# Patient Record
Sex: Female | Born: 1995 | Race: Black or African American | Hispanic: No | Marital: Single | Smoking: Former smoker
Health system: Southern US, Community
[De-identification: ages and names within clinical notes are randomized; demographics above are authoritative.]

## PROBLEM LIST (undated history)

## (undated) DIAGNOSIS — L309 Dermatitis, unspecified: Secondary | ICD-10-CM

## (undated) DIAGNOSIS — Z87440 Personal history of urinary (tract) infections: Secondary | ICD-10-CM

## (undated) HISTORY — DX: Dermatitis, unspecified: L30.9

## (undated) HISTORY — PX: OTHER SURGICAL HISTORY: SHX169

## (undated) HISTORY — DX: Personal history of urinary (tract) infections: Z87.440

---
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
## 2015-06-29 ENCOUNTER — Ambulatory Visit (INDEPENDENT_AMBULATORY_CARE_PROVIDER_SITE_OTHER): Payer: Medicaid Other | Admitting: Family Medicine

## 2015-06-29 ENCOUNTER — Encounter: Payer: Self-pay | Admitting: Family Medicine

## 2015-06-29 VITALS — BP 110/74 | HR 79 | Temp 98.4°F | Resp 17 | Ht 71.0 in | Wt 133.9 lb

## 2015-06-29 DIAGNOSIS — W57XXXA Bitten or stung by nonvenomous insect and other nonvenomous arthropods, initial encounter: Secondary | ICD-10-CM

## 2015-06-29 DIAGNOSIS — S40262A Insect bite (nonvenomous) of left shoulder, initial encounter: Secondary | ICD-10-CM

## 2015-06-29 DIAGNOSIS — L989 Disorder of the skin and subcutaneous tissue, unspecified: Secondary | ICD-10-CM | POA: Insufficient documentation

## 2015-06-29 NOTE — Progress Notes (Signed)
Name: KAYDE ATKERSON   MRN: [id number]    DOB: 1996-04-08   Date:06/29/2015       Progress Note  Subjective  Chief Complaint  Chief Complaint  Patient presents with  . Acute Visit    Spider Bite left arm (Dr. Carlynn Purl pt) x3-4 days    HPI Insect bite  Pt. Is here for evaluation of what appears to be a spider bite. She was at her home and woke up with intense pain and itching on her left shoulder. She describes the lesion as a 'blackhead' which later developed  a'circle around it'. She noticed a spider in her clothes the next day and thinks that she may have been bitten by a spider. She thinks it may have been a 'wool' spider. She has since experienced pain and swelling at the site of the bite. She has no fevers, chills, nausea, vomiting.   History reviewed. No pertinent past medical history.  History reviewed. No pertinent past surgical history.  History reviewed. No pertinent family history.  Social History   Social History  . Marital Status: Single    Spouse Name: N/A  . Number of Children: N/A  . Years of Education: N/A   Occupational History  . Not on file.   Social History Main Topics  . Smoking status: Current Every Day Smoker  . Smokeless tobacco: Never Used  . Alcohol Use: 0.0 oz/week    0 Standard drinks or equivalent per week     Comment: occasional  . Drug Use: No  . Sexual Activity:    Partners: Male    Birth Control/ Protection: Condom   Other Topics Concern  . Not on file   Social History Narrative  . No narrative on file    No current outpatient prescriptions on file.  No Known Allergies   Review of Systems  Constitutional: Negative for fever and chills.  Skin: Positive for itching and rash.     Objective  Filed Vitals:   06/29/15 1448  BP: 110/74  Pulse: 79  Temp: 98.4 F (36.9 C)  TempSrc: Oral  Resp: 17  Height:  (1.803 m)  Weight: 133 lb 14.4 oz (60.737 kg)  SpO2: 99%    Physical Exam  Constitutional: She is  well-developed, well-nourished, and in no distress.  Cardiovascular: Normal rate and regular rhythm.   Pulmonary/Chest: Effort normal and breath sounds normal.  Skin:     Localized maculo-papulr lesion with surrounding tenderness to mild palpation. No pus, no erythema. Multiple macular oval shaped lesions on her abdomen and breasts.  Nursing note and vitals reviewed.  Assessment & Plan  1. Insect bite of shoulder, left, initial encounter  Obtain labs to rule out tickborne diseases. Recommended application of topical antibiotic. Patient to RTC if she has any concerning symptoms such as fever, malaise, nausea, vomiting etc. Patient verbalized understanding.  - CBC w/Diff/Platelet - Basic Metabolic Panel (BMET) - Lyme Disease, IgM, Early Test w/ Rflx - [place] spotted fvr ab, IgM-blood  2. Skin lesion  - Ambulatory referral to Dermatology   Dionisios Ricci Asad [facility] 06/29/2015 3:25 PM

## 2015-07-05 ENCOUNTER — Ambulatory Visit: Payer: Self-pay | Admitting: Family Medicine

## 2015-08-09 ENCOUNTER — Ambulatory Visit: Payer: Medicaid Other | Admitting: Family Medicine

## 2015-08-18 ENCOUNTER — Encounter: Payer: Self-pay | Admitting: Family Medicine

## 2015-08-18 ENCOUNTER — Ambulatory Visit (INDEPENDENT_AMBULATORY_CARE_PROVIDER_SITE_OTHER): Payer: Medicaid Other | Admitting: Family Medicine

## 2015-08-18 VITALS — BP 104/62 | HR 93 | Temp 98.2°F | Resp 14 | Ht 69.0 in | Wt 134.2 lb

## 2015-08-18 DIAGNOSIS — R309 Painful micturition, unspecified: Secondary | ICD-10-CM | POA: Diagnosis not present

## 2015-08-18 DIAGNOSIS — Z23 Encounter for immunization: Secondary | ICD-10-CM | POA: Diagnosis not present

## 2015-08-18 DIAGNOSIS — L309 Dermatitis, unspecified: Secondary | ICD-10-CM | POA: Insufficient documentation

## 2015-08-18 LAB — POCT URINALYSIS DIPSTICK
Bilirubin, UA: NEGATIVE
GLUCOSE UA: NEGATIVE
Ketones, UA: NEGATIVE
Nitrite, UA: POSITIVE
PH UA: 6.5
Protein, UA: NEGATIVE
SPEC GRAV UA: 1.025
UROBILINOGEN UA: 0.2

## 2015-08-18 MED ORDER — CIPROFLOXACIN HCL 250 MG PO TABS: 250.0000 mg | ORAL_TABLET | Freq: Two times a day (BID) | ORAL | Status: DC

## 2015-08-18 NOTE — Progress Notes (Signed)
Name: Marcille BuffyRaven T Strahm   MRN: [id number]    DOB: [birth year]   Date:08/18/2015       Progress Note  Subjective  Chief Complaint  Chief Complaint  Patient presents with  . Urinary Tract Infection    onset 2 weeks, painful urination, hematuria and frequency    HPI  Dysuria: symptoms started about 2 weeks ago, with frequency, nocturia, hematuria - only noticed when she wipes, no hesitancy. Worse symptoms at the end of micturition. No back pain, no fever, mild discomfort during sex, no vulva rash, no vaginal discharge  Patient Active Problem List   Diagnosis Date Noted  . Chronic dermatitis 08/18/2015    History reviewed. No pertinent past surgical history.  History reviewed. No pertinent family history.  Social History   Social History  . Marital Status: Single    Spouse Name: N/A  . Number of Children: N/A  . Years of Education: N/A   Occupational History  . Not on file.   Social History Main Topics  . Smoking status: Current Every Day Smoker  . Smokeless tobacco: Never Used  . Alcohol Use: 0.0 oz/week    0 Standard drinks or equivalent per week     Comment: occasional  . Drug Use: No  . Sexual Activity:    Partners: Male    Birth Control/ Protection: Condom   Other Topics Concern  . Not on file   Social History Narrative    No current outpatient prescriptions on file.  No Known Allergies   ROS   Ten systems reviewed and is negative except as mentioned in HPI   Objective  Filed Vitals:   08/18/15 1528  BP: 104/62  Pulse: 93  Temp: 98.2 F (36.8 C)  TempSrc: Oral  Resp: 14  Height: 5\' 9"  (1.753 m)  Weight: 134 lb 3.2 oz (60.873 kg)  SpO2: 98%    Body mass index is 19.81 kg/(m^2).  Physical Exam  Constitutional: Patient appears well-developed and well-nourished.No distress.  HEENT: head atraumatic, normocephalic, pupils equal and reactive to light,  neck supple, throat within normal limits Cardiovascular: Normal rate, regular rhythm and  normal heart sounds.  No murmur heard. No BLE edema. Pulmonary/Chest: Effort normal and breath sounds normal. No respiratory distress. Abdominal: Soft.  There is no tenderness. Negative for CVA tenderness Psychiatric: Patient has a normal mood and affect. behavior is normal. Judgment and thought content normal.  Recent Results (from the past 2160 hour(s))  POCT urinalysis dipstick     Status: Abnormal   Collection Time: 08/18/15  3:34 PM  Result Value Ref Range   Color, UA yellow    Clarity, UA clear    Glucose, UA neg    Bilirubin, UA neg    Ketones, UA neg    Spec Grav, UA 1.025    Blood, UA 3+    pH, UA 6.5    Protein, UA neg    Urobilinogen, UA 0.2    Nitrite, UA positive    Leukocytes, UA large (3+) (A) Negative      PHQ2/9: Depression screen [facility] 06/29/2015  Decreased Interest 0  Down, Depressed, Hopeless 3  PHQ - 2 Score 3  Altered sleeping 1  Tired, decreased energy 0  Change in appetite 0  Feeling bad or failure about yourself  0  Trouble concentrating 0  Moving slowly or fidgety/restless 0  Suicidal thoughts 0  PHQ-9 Score 4  Difficult doing work/chores Somewhat difficult     Fall Risk: Fall Risk  06/29/2015  Falls in the past year? No      Assessment & Plan  1. Painful urination  Discussed possible diagnosis we will start on antibiotics today  - POCT urinalysis dipstick - Urine culture - GC/chlamydia probe amp, urine - ciprofloxacin (CIPRO) 250 MG tablet; Take 1 tablet (250 mg total) by mouth 2 (two) times daily.  Dispense: 6 tablet; Refill: 0  2. Needs flu shot  - Flu Vaccine QUAD 36+ mos PF IM (Fluarix & Fluzone Quad PF)

## 2015-08-19 ENCOUNTER — Other Ambulatory Visit: Payer: Self-pay | Admitting: Family Medicine

## 2015-08-21 LAB — URINE CULTURE

## 2015-08-22 LAB — PLEASE NOTE

## 2015-08-23 LAB — GC/CHLAMYDIA PROBE AMP
Chlamydia trachomatis, NAA: NEGATIVE
NEISSERIA GONORRHOEAE BY PCR: NEGATIVE

## 2015-08-23 LAB — SPECIMEN STATUS REPORT

## 2015-10-27 ENCOUNTER — Encounter: Payer: Self-pay | Admitting: *Deleted

## 2015-10-27 ENCOUNTER — Emergency Department: Payer: Medicaid Other

## 2015-10-27 ENCOUNTER — Emergency Department
Admission: EM | Admit: 2015-10-27 | Discharge: 2015-10-28 | Disposition: A | Discharge status: Home / Self Care | Payer: Medicaid Other | Attending: Emergency Medicine | Admitting: Emergency Medicine

## 2015-10-27 DIAGNOSIS — R251 Tremor, unspecified: Secondary | ICD-10-CM | POA: Diagnosis not present

## 2015-10-27 DIAGNOSIS — F1010 Alcohol abuse, uncomplicated: Secondary | ICD-10-CM

## 2015-10-27 DIAGNOSIS — F4324 Adjustment disorder with disturbance of conduct: Secondary | ICD-10-CM

## 2015-10-27 DIAGNOSIS — Z793 Long term (current) use of hormonal contraceptives: Secondary | ICD-10-CM | POA: Insufficient documentation

## 2015-10-27 DIAGNOSIS — Z792 Long term (current) use of antibiotics: Secondary | ICD-10-CM | POA: Diagnosis not present

## 2015-10-27 DIAGNOSIS — R45851 Suicidal ideations: Secondary | ICD-10-CM

## 2015-10-27 DIAGNOSIS — R42 Dizziness and giddiness: Secondary | ICD-10-CM | POA: Diagnosis not present

## 2015-10-27 DIAGNOSIS — R55 Syncope and collapse: Secondary | ICD-10-CM | POA: Diagnosis not present

## 2015-10-27 DIAGNOSIS — F17200 Nicotine dependence, unspecified, uncomplicated: Secondary | ICD-10-CM | POA: Insufficient documentation

## 2015-10-27 DIAGNOSIS — Z3202 Encounter for pregnancy test, result negative: Secondary | ICD-10-CM | POA: Insufficient documentation

## 2015-10-27 DIAGNOSIS — F419 Anxiety disorder, unspecified: Secondary | ICD-10-CM | POA: Insufficient documentation

## 2015-10-27 DIAGNOSIS — R4182 Altered mental status, unspecified: Secondary | ICD-10-CM | POA: Diagnosis present

## 2015-10-27 DIAGNOSIS — F1210 Cannabis abuse, uncomplicated: Secondary | ICD-10-CM | POA: Diagnosis not present

## 2015-10-27 LAB — URINE DRUG SCREEN, QUALITATIVE (ARMC ONLY)
Amphetamines, Ur Screen: NOT DETECTED
BARBITURATES, UR SCREEN: NOT DETECTED
BENZODIAZEPINE, UR SCRN: NOT DETECTED
Cannabinoid 50 Ng, Ur ~~LOC~~: POSITIVE — AB
Cocaine Metabolite,Ur ~~LOC~~: NOT DETECTED
MDMA (Ecstasy)Ur Screen: NOT DETECTED
METHADONE SCREEN, URINE: NOT DETECTED
OPIATE, UR SCREEN: NOT DETECTED
Phencyclidine (PCP) Ur S: NOT DETECTED
Tricyclic, Ur Screen: NOT DETECTED

## 2015-10-27 LAB — CBC WITH DIFFERENTIAL/PLATELET
BASOS PCT: 1 %
Basophils Absolute: 0 10*3/uL (ref 0–0.1)
EOS ABS: 0.1 10*3/uL (ref 0–0.7)
EOS PCT: 1 %
HCT: 45.8 % (ref 35.0–47.0)
Hemoglobin: 15.7 g/dL (ref 12.0–16.0)
LYMPHS ABS: 2.8 10*3/uL (ref 1.0–3.6)
Lymphocytes Relative: 52 %
MCH: 31.2 pg (ref 26.0–34.0)
MCHC: 34.3 g/dL (ref 32.0–36.0)
MCV: 91.1 fL (ref 80.0–100.0)
MONOS PCT: 8 %
Monocytes Absolute: 0.4 10*3/uL (ref 0.2–0.9)
Neutro Abs: 2.1 10*3/uL (ref 1.4–6.5)
Neutrophils Relative %: 38 %
PLATELETS: 202 10*3/uL (ref 150–440)
RBC: 5.03 MIL/uL (ref 3.80–5.20)
RDW: 12.6 % (ref 11.5–14.5)
WBC: 5.4 10*3/uL (ref 3.6–11.0)

## 2015-10-27 LAB — COMPREHENSIVE METABOLIC PANEL
ALT: 16 U/L (ref 14–54)
ANION GAP: 4 — AB (ref 5–15)
AST: 15 U/L (ref 15–41)
Albumin: 3.9 g/dL (ref 3.5–5.0)
Alkaline Phosphatase: 43 U/L (ref 38–126)
BILIRUBIN TOTAL: 0.8 mg/dL (ref 0.3–1.2)
BUN: 11 mg/dL (ref 6–20)
CO2: 25 mmol/L (ref 22–32)
Calcium: 8.5 mg/dL — ABNORMAL LOW (ref 8.9–10.3)
Chloride: 111 mmol/L (ref 101–111)
Creatinine, Ser: 0.94 mg/dL (ref 0.44–1.00)
GFR calc Af Amer: >60 mL/min (ref >60)
Glucose, Bld: 98 mg/dL (ref 65–99)
POTASSIUM: 3.4 mmol/L — AB (ref 3.5–5.1)
Sodium: 140 mmol/L (ref 135–145)
TOTAL PROTEIN: 6.3 g/dL — AB (ref 6.5–8.1)

## 2015-10-27 LAB — URINALYSIS COMPLETE WITH MICROSCOPIC (ARMC ONLY)
BILIRUBIN URINE: NEGATIVE
Glucose, UA: NEGATIVE mg/dL
Ketones, ur: NEGATIVE mg/dL
LEUKOCYTES UA: NEGATIVE
Nitrite: NEGATIVE
PH: 7 (ref 5.0–8.0)
PROTEIN: NEGATIVE mg/dL
Specific Gravity, Urine: 1.012 (ref 1.005–1.030)

## 2015-10-27 LAB — SALICYLATE LEVEL

## 2015-10-27 LAB — TROPONIN I: Troponin I: <0.03 ng/mL (ref <0.031)

## 2015-10-27 LAB — HCG, QUANTITATIVE, PREGNANCY

## 2015-10-27 LAB — ACETAMINOPHEN LEVEL: Acetaminophen (Tylenol), Serum: <10 ug/mL — ABNORMAL LOW (ref 10–30)

## 2015-10-27 MED ORDER — LORAZEPAM 0.5 MG PO TABS: 0.5000 mg | ORAL_TABLET | Freq: Once | ORAL | Status: DC

## 2015-10-27 MED ORDER — SODIUM CHLORIDE 0.9 % IV BOLUS (SEPSIS)
1000.0000 mL | Freq: Once | INTRAVENOUS | Status: AC
  Administered 2015-10-27: 1000 mL via INTRAVENOUS

## 2015-10-27 NOTE — ED Notes (Signed)
[facility]. Per EMS pt was found on the couch, unresponsive with constricted pupils. Pt given 2mg  narcan in route, AAOx4 since then. Per pt, pt began to have abd pain, went to use restroom, became dizzy and passed out. Pt called for brother who carried pt to couch where she was found by EMS. Pt denies taking any medications or drinking ETOH. Pt states used marijuana yesterday. Upon arrival pt AAOx4, denies any pain at this time. Pt states she does have thoughts of wanting to hurt herself due to death of brother and step father this past year, but denies any specific plans.
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
## 2015-10-27 NOTE — ED Notes (Signed)
Pt explained and updated regarding status, pt became very agitated and upset. MD Quale at bedside speaking to family. [employer] officers at bedside attempting to calm pt down.

## 2015-10-27 NOTE — ED Notes (Signed)
Pt used bedpan, tolerated well, no acute distress noted at this time.

## 2015-10-27 NOTE — ED Notes (Signed)

## 2015-10-27 NOTE — ED Notes (Signed)
MD Quale at bedside updating pt and pt's family.

## 2015-10-27 NOTE — ED Notes (Signed)
[facility]  Will more patient when officer arrives.

## 2015-10-27 NOTE — ED Notes (Signed)
D: Patient denies SI/HI/AVH. Patient wanded by security and oriented to unit.  No distress noted.  Patient is pleasant and cooperative.  Q15 min checks continued for patient safety.

## 2015-10-27 NOTE — [facility] Counselor (Signed)
Writer ran pt with Psych MD Dr. Toni Amendlapacs who stated that Pt will be assessed in the morning.

## 2015-10-27 NOTE — ED Provider Notes (Addendum)
[facility] Emergency Department Provider Note REMINDER - THIS NOTE IS NOT A FINAL MEDICAL RECORD UNTIL IT IS SIGNED. UNTIL THEN, THE CONTENT BELOW MAY REFLECT INFORMATION FROM A DOCUMENTATION TEMPLATE, NOT THE ACTUAL PATIENT VISIT. ____________________________________________  Time seen: Approximately 3:53 PM  I have reviewed the triage vital signs and the nursing notes.   HISTORY  Chief Complaint Altered Mental Status    HPI Rebekah Dennis is a 19 y.o. female no significant medical history. Patient reports that she is using the bathroom, she began feeling shaky and lightheaded. She got sweaty and then stood up and passed out. She denies injury or headache, she reports that her brother carry her over to the couch. EMS reported on their arrival are called for breathing difficulty, they're concerned about a low respiratory rate and administered naloxone seemingly with some effect. The patient denies any drug use, but does admit otherwise to using marijuana. She does endorse that she has occasional suicidal thoughts because of the recent death of family members, but no active plan to hurt herself and denies any attempt to harm herself.  At the present time she states she feels cold, she is shaking, otherwise denies being in any pain or discomfort. She reports that it is not uncommon for her to get dizzy when she uses the bathroom, and that she has come close to passing out a few times after standing up, but has never actually passed out.  She denies pregnancy, she is currently taking birth control.   Past Medical History  Diagnosis Date  . Chronic dermatitis     Patient Active Problem List   Diagnosis Date Noted  . Chronic dermatitis 08/18/2015    History reviewed. No pertinent past surgical history.  Current Outpatient Rx  Name  Route  Sig  Dispense  Refill  . ciprofloxacin (CIPRO) 250 MG tablet   Oral   Take 1 tablet (250 mg total) by mouth 2 (two) times  daily.   6 tablet   0   . Etonogestrel (NEXPLANON [place])   Subcutaneous   Inject 1 Units into the skin once.           Allergies Review of patient's allergies indicates no known allergies.  History reviewed. No pertinent family history.  Social History Social History  Substance Use Topics  . Smoking status: Current Every Day Smoker  . Smokeless tobacco: Never Used  . Alcohol Use: 0.0 oz/week    0 Standard drinks or equivalent per week     Comment: occasional    Review of Systems Constitutional: No fever/chills, she does report feeling cold right now Eyes: No visual changes. ENT: No sore throat. Cardiovascular: Denies chest pain. Respiratory: Denies shortness of breath. Gastrointestinal: No abdominal pain.  No nausea, no vomiting.  No diarrhea.  No constipation. Genitourinary: Negative for dysuria. Musculoskeletal: Negative for back pain. Skin: Negative for rash. Neurological: Negative for headaches, focal weakness or numbness.  10-point ROS otherwise negative.  ____________________________________________   PHYSICAL EXAM:  VITAL SIGNS: ED Triage Vitals  Enc Vitals Group     BP 10/27/15 1529 124/75 mmHg     Pulse Rate 10/27/15 1529 66     Resp 10/27/15 1529 21     Temp 10/27/15 1529 98.4 F (36.9 C)     Temp Source 10/27/15 1529 Oral     SpO2 10/27/15 1529 98 %     Weight 10/27/15 1529 130 lb (58.968 kg)     Height 10/27/15 1529 5\' 9"  (  1.753 m)     Head Cir --      Peak Flow --      Pain Score --      Pain Loc --      Pain Edu? --      Excl. in GC? --    Constitutional: Alert and oriented. Somnolent, and tremulous but otherwise well appearing and in no acute distress. Eyes: Conjunctivae are normal. PERRL. EOMI. Head: Atraumatic. Nose: No congestion/rhinnorhea. Mouth/Throat: Mucous membranes are dry.  Oropharynx non-erythematous. Neck: No stridor.   Cardiovascular: Normal rate, regular rhythm. Grossly normal heart sounds.  Good peripheral  circulation. Respiratory: Normal respiratory effort.  No retractions. Lungs CTAB. Gastrointestinal: Soft and nontender. No distention. No abdominal bruits. No CVA tenderness. Musculoskeletal: No lower extremity tenderness nor edema.  No joint effusions. Neurologic:  Normal speech and language. No gross focal neurologic deficits are appreciated. Moves all extremities 5 out of 5. Normal reflexes. Normal cranial nerve exam. Normal extraocular movements. Skin:  Skin is warm, dry and slightly cool to touch. No rash noted. Psychiatric: Mood and affect are anxious. Speech and behavior are normal.  ____________________________________________   LABS (all labs ordered are listed, but only abnormal results are displayed)  Labs Reviewed  URINALYSIS COMPLETEWITH MICROSCOPIC ([facility] ONLY) - Abnormal; Notable for the following:    Color, Urine YELLOW (*)    APPearance HAZY (*)    Hgb urine dipstick 2+ (*)    Bacteria, UA FEW (*)    Squamous Epithelial / LPF 6-30 (*)    All other components within normal limits  URINE DRUG SCREEN, QUALITATIVE ([facility] ONLY) - Abnormal; Notable for the following:    Cannabinoid 50 Ng, Ur Rodney POSITIVE (*)    All other components within normal limits  ACETAMINOPHEN LEVEL - Abnormal; Notable for the following:    Acetaminophen (Tylenol), Serum <10 (*)    All other components within normal limits  COMPREHENSIVE METABOLIC PANEL - Abnormal; Notable for the following:    Potassium 3.4 (*)    Calcium 8.5 (*)    Total Protein 6.3 (*)    Anion gap 4 (*)    All other components within normal limits  SALICYLATE LEVEL  CBC WITH DIFFERENTIAL/PLATELET  HCG, QUANTITATIVE, PREGNANCY  TROPONIN I   ____________________________________________  EKG  ED ECG REPORT I, QUALE, MARK, the attending physician, personally viewed and interpreted this ECG.  Date: 10/27/2015 EKG Time: 1535 Rate: 70 Rhythm: normal sinus rhythm QRS Axis: normal Intervals: normal ST/T Wave  abnormalities: normal Conduction Disutrbances: none Narrative Interpretation: unremarkable. No prolonged QT, [place], or evidence of WPW.  ____________________________________________  RADIOLOGY     DG Chest Port 1 View (Final result) Result time: 10/27/15 16:09:49   Final result by Rad Results In Interface (10/27/15 16:09:49)   Narrative:   CLINICAL DATA: Initial encounter for altered mental status starting today.  EXAM: PORTABLE CHEST 1 VIEW  COMPARISON: None.  FINDINGS: 1548 hours. The lungs are clear wiithout focal pneumonia, edema, pneumothorax or pleural effusion. The cardiopericardial silhouette is within normal limits for size. Imaged bony structures of the thorax are intact. Telemetry leads overlie the chest.  IMPRESSION: No active disease.   Electronically Signed By: Kennith Center M.D. On: 10/27/2015 16:09       ____________________________________________   PROCEDURES  Procedure(s) performed: None  Critical Care performed: No  ____________________________________________   INITIAL IMPRESSION / ASSESSMENT AND PLAN / ED COURSE  Pertinent labs & imaging results that were available during my care of the patient were  reviewed by me and considered in my medical decision making (see chart for details).  Patient presents after an episode of unresponsiveness, patient reports this occurred just after standing from a toilet denies drug use, though EMS did administer naloxone reported possibly some affect. EMS did not finding any drug paraphernalia other signs of overdose in the home.  In the setting of the patient's history, vasovagal type syncope appears possible. In addition she is endorsing some passive suicidal thoughts, but denies any active plan therefore will place her under involuntary commitment given this unusual circumstance. Physical exam is very reassuring, neurologically intact, currently no signs or symptoms suggest need for CT imaging  head or neck. EKG is reassuring. We will send labs, we'll also screen via drug screen for potential illicit substances, for which the patient doesn't admit to marijuana use but nothing else. Continue to monitor her, hydrate her generously, and follow her clinically with observation and telemetry in the ER.  ----------------------------------------- 3:57 PM on 10/27/2015 -----------------------------------------  Patient fully awake and alert, appears improved. Resting comfortably in no distress.  ----------------------------------------- 6:10 PM on 10/27/2015 -----------------------------------------  Patient ambulatory, eating. No distress. She did became upset when we notified her of our placing her under involuntary commitment, discussed with her family who are also present. Patient and family agreeable to IVC, apparently her brother was murdered recently and has been causing some depressive-type symptoms, and the patient is endorsing passive suicidal thoughts at times. IVC for safety. Medically cleared to the [facility] at this time. ____________________________________________   FINAL CLINICAL IMPRESSION(S) / ED DIAGNOSES  Final diagnoses:  Syncope and collapse  Passive suicidal ideations      Sharyn Creamer, MD 10/27/15 1812   ----------------------------------------- 9:41 PM on 10/27/2015 -----------------------------------------  Patient requesting something to assist her with sleep. I will give her 0.5 mg of Ativan. Denies any drug allergies. Currently awake and alert.  Sharyn Creamer, MD 10/27/15 2141

## 2015-10-27 NOTE — ED Notes (Signed)
Pt given food and drink, calmed down, moved to the quad at this time. Report given to Annette StableBill, Charity fundraiserN.

## 2015-10-27 NOTE — ED Notes (Signed)
BEHAVIORAL HEALTH ROUNDING Patient sleeping: No. Patient alert and oriented: yes Behavior appropriate: Yes.  ; If no, describe:  Nutrition and fluids offered: Yes  Toileting and hygiene offered: Yes  Sitter present: no Law enforcement present: Yes  

## 2015-10-28 DIAGNOSIS — F4324 Adjustment disorder with disturbance of conduct: Secondary | ICD-10-CM

## 2015-10-28 DIAGNOSIS — F1210 Cannabis abuse, uncomplicated: Secondary | ICD-10-CM

## 2015-10-28 DIAGNOSIS — R45851 Suicidal ideations: Secondary | ICD-10-CM

## 2015-10-28 DIAGNOSIS — F1010 Alcohol abuse, uncomplicated: Secondary | ICD-10-CM

## 2015-10-28 NOTE — ED Notes (Signed)
Patient given lunch tray.

## 2015-10-28 NOTE — Discharge Instructions (Signed)
Syncope °Syncope is a medical term for fainting or passing out. This means you lose consciousness and drop to the ground. People are generally unconscious for less than 5 minutes. You may have some muscle twitches for up to 15 seconds before waking up and returning to normal. Syncope occurs more often in older adults, but it can happen to anyone. While most causes of syncope are not dangerous, syncope can be a sign of a serious medical problem. It is important to seek medical care.  °CAUSES  °Syncope is caused by a sudden drop in blood flow to the brain. The specific cause is often not determined. Factors that can bring on syncope include: °· Taking medicines that lower blood pressure. °· Sudden changes in posture, such as standing up quickly. °· Taking more medicine than prescribed. °· Standing in one place for too long. °· Seizure disorders. °· Dehydration and excessive exposure to heat. °· Low blood sugar (hypoglycemia). °· Straining to have a bowel movement. °· Heart disease, irregular heartbeat, or other circulatory problems. °· Fear, emotional distress, seeing blood, or severe pain. °SYMPTOMS  °Right before fainting, you may: °· Feel dizzy or light-headed. °· Feel nauseous. °· See all white or all black in your field of vision. °· Have cold, clammy skin. °DIAGNOSIS  °Your health care provider will ask about your symptoms, perform a physical exam, and perform an electrocardiogram (ECG) to record the electrical activity of your heart. Your health care provider may also perform other heart or blood tests to determine the cause of your syncope which may include: °· Transthoracic echocardiogram (TTE). During echocardiography, sound waves are used to evaluate how blood flows through your heart. °· Transesophageal echocardiogram (TEE). °· Cardiac monitoring. This allows your health care provider to monitor your heart rate and rhythm in real time. °· Holter monitor. This is a portable device that records your  heartbeat and can help diagnose heart arrhythmias. It allows your health care provider to track your heart activity for several days, if needed. °· Stress tests by exercise or by giving medicine that makes the heart beat faster. °TREATMENT  °In most cases, no treatment is needed. Depending on the cause of your syncope, your health care provider may recommend changing or stopping some of your medicines. °HOME CARE INSTRUCTIONS °· Have someone stay with you until you feel stable. °· Do not drive, use machinery, or play sports until your health care provider says it is okay. °· Keep all follow-up appointments as directed by your health care provider. °· Lie down right away if you start feeling like you might faint. Breathe deeply and steadily. Wait until all the symptoms have passed. °· Drink enough fluids to keep your urine clear or pale yellow. °· If you are taking blood pressure or heart medicine, get up slowly and take several minutes to sit and then stand. This can reduce dizziness. °SEEK IMMEDIATE MEDICAL CARE IF:  °· You have a severe headache. °· You have unusual pain in the chest, abdomen, or back. °· You are bleeding from your mouth or rectum, or you have black or tarry stool. °· You have an irregular or very fast heartbeat. °· You have pain with breathing. °· You have repeated fainting or seizure-like jerking during an episode. °· You faint when sitting or lying down. °· You have confusion. °· You have trouble walking. °· You have severe weakness. °· You have vision problems. °If you fainted, call your local emergency services (911 in [place] Do not drive   yourself to the hospital.    This information is not intended to replace advice given to you by your health care provider. Make sure you discuss any questions you have with your health care provider.   Document Released: 10/15/2005 Document Revised: 03/01/2015 Document Reviewed: 12/14/2011 Elsevier Interactive Patient Education 2016 [employer]  Suicidal Feelings: How to Help Yourself Suicide is the taking of one's own life. If you feel as though life is getting too tough to handle and are thinking about suicide, get help right away. To get help:  Call your local emergency services (911 in the [place]  Call a suicide hotline to speak with a trained counselor who understands how you are feeling. The following is a list of suicide hotlines in the [place]. For a list of hotlines in [place], visit [employer]ributor.itwww.suicide.org/hotlines/international/[place]-suicide-hotlines.html.  1-800-273-TALK 782-416-4031(1-307-161-0467).  1-800-SUICIDE (438)434-8319(1-503-685-8725).  863-099-37391-(623)648-3181. This is a hotline for Spanish speakers.  4-696-295-2WUX1-800-799-4TTY 360-385-4560(1-727 670 3486). This is a hotline for TTY users.  1-866-4-U-TREVOR (810)425-7487(1-(540)653-5164). This is a hotline for lesbian, gay, bisexual, transgender, or questioning youth.  Contact a crisis center or a local suicide prevention center. To find a crisis center or suicide prevention center:  Call your local hospital, clinic, community service organization, mental health center, social service provider, or health department. Ask for assistance in connecting to a crisis center.  Visit https://www.patel-king.com/www.suicidepreventionlifeline.org/getinvolved/locator for a list of crisis centers in the [place], or visit www.suicideprevention.ca/thinking-about-suicide/find-a-crisis-centre for a list of centers in [place].  Visit the following websites:  National Suicide Prevention Lifeline: www.suicidepreventionlifeline.org  Hopeline: www.hopeline.com  [employer] for Suicide Prevention: https://www.ayers.com/www.afsp.org  The [employer] Project (for lesbian, gay, bisexual, transgender, or questioning youth): www.thetrevorproject.org HOW CAN I HELP MYSELF FEEL BETTER?  Promise yourself that you will not do anything drastic when you have suicidal feelings. Remember, there is hope. Many people have gotten through suicidal thoughts and feelings, and you will, too.  You may have gotten through them before, and this proves that you can get through them again.  Let family, friends, teachers, or counselors know how you are feeling. Try not to isolate yourself from those who care about you. Remember, they will want to help you. Talk with someone every day, even if you do not feel sociable. Face-to-face conversation is best.  Call a mental health professional and see one regularly.  Visit your primary health care provider every year.  Eat a well-balanced diet, and space your meals so you eat regularly.  Get plenty of rest.  Avoid alcohol and drugs, and remove them from your home. They will only make you feel worse.  If you are thinking of taking a lot of medicine, give your medicine to someone who can give it to you one day at a time. If you are on antidepressants and are concerned you will overdose, let your health care provider know so he or she can give you safer medicines. Ask your mental health professional about the possible side effects of any medicines you are taking.  Remove weapons, poisons, knives, and anything else that could harm you from your home.  Try to stick to routines. Follow a schedule every day. Put self-care on your schedule.  Make a list of realistic goals, and cross them off when you achieve them. Accomplishments give a sense of worth.  Wait until you are feeling better before doing the things you find difficult or unpleasant.  Exercise if you are able. You will feel better if you exercise for even a half hour each  day.  Go out in the sun or into nature. This will help you recover from depression faster. If you have a favorite place to walk, go there.  Do the things that have always given you pleasure. Play your favorite music, read a good book, paint a picture, play your favorite instrument, or do anything else that takes your mind off your depression if it is safe to do.  Keep your living space well lit.  When you are  feeling well, write yourself a letter about tips and support that you can read when you are not feeling well.  Remember that life's difficulties can be sorted out with help. Conditions can be treated. You can work on thoughts and strategies that serve you well.   This information is not intended to replace advice given to you by your health care provider. Make sure you discuss any questions you have with your health care provider.   Document Released: 04/21/2003 Document Revised: 11/05/2014 Document Reviewed: 02/09/2014 Elsevier Interactive Patient Education [employer]

## 2015-10-28 NOTE — BH Assessment (Signed)
Spoke with patient to assessed current mental and emotional state. Patient reports she is not having SI/HI and AV/H. She admits to saying she had thoughts of hurting herself but "it was a misunderstanding." She further explain, "When the nurse asked those questions, they were putting me on those monitor things. When I tried to explain myself, they cut me off and start asking me other questions. To be honest, I don't know why I'm back here ([facility])..."  Patient reports of loosing her biological when she was younger, her brother and step father was murdered within a two weeks frame.  Patient denies SI/HI and AV/H. She is mainly concerned about why she having fainting spells.

## 2015-10-28 NOTE — ED Provider Notes (Signed)
-----------------------------------------   6:45 AM on 10/28/2015 -----------------------------------------   Blood pressure 118/77, pulse 83, temperature 98.9 F (37.2 C), temperature source Oral, resp. rate 18, height 5\' 9"  (1.753 m), weight 130 lb (58.968 kg), SpO2 98 %.  The patient had no acute events since last update.  Calm and cooperative at this time.  Disposition is pending per Psychiatry/Behavioral Medicine team recommendations.     Irean HongJade J Sung, MD 10/28/15 [phone]

## 2015-10-28 NOTE — ED Notes (Signed)
Pt states that she never felt suicidal just upset over her brothers death she denies si/hi or avh she is very pleasant  and cooperative ,has had a cousin called to inquire about her and that he will pick her up if dc. She admits to not eating well and some depressive symptoms since the death of her brother

## 2015-10-28 NOTE — ED Notes (Signed)
Pt. Noted in room. No complaints or concerns voiced. No distress or abnormal behavior noted. Will continue to monitor with security cameras. Q 15 minute rounds continue. 

## 2015-10-28 NOTE — ED Notes (Signed)
Pt has a visitor which is her boyfriend

## 2015-10-28 NOTE — ED Notes (Signed)
Pt. Noted in room. No complaints or concerns voiced. No distress or abnormal behavior noted. Will continue to monitor with security cameras. Q 15 minute rounds continue.pt offered no further c/o also did state she was enrolled in [facility] for criminal justice

## 2015-10-28 NOTE — ED Notes (Signed)
Asking to use the phone told her when phone time i will give it to her

## 2015-10-28 NOTE — ED Provider Notes (Signed)
-----------------------------------------   1:38 PM on 10/28/2015 -----------------------------------------  Case discussed with psychiatry Dr. Toni Amendlapacs after his evaluation in the emergency department. Patient is denying suicidal ideation since arrival in the ER and denies to him. He finds that she is upbeat and at very low risk of harm to self or others. As this is psychiatrically stable. She remains medically stable with normal vital signs and no evidence of any cardiopulmonary pathology sepsis or other concerning cause of syncope which was likely orthostatic versus vasovagal. We'll have her follow-up with [facility].  Sharman CheekPhillip Klyde Banka, MD 10/28/15 [phone]

## 2015-10-28 NOTE — ED Notes (Signed)
Dr Clapac here to see pt 

## 2015-10-28 NOTE — ED Notes (Signed)
Pt. Noted in room. No complaints or concerns voiced. No distress or abnormal behavior noted. Will continue to monitor with security cameras. Q 15 minute rounds continue.,has no c/o

## 2015-10-28 NOTE — ED Notes (Signed)
Patient received breakfast tray 

## 2015-10-28 NOTE — ED Notes (Signed)
Pt is aware she is waiting to see psych MD
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
## 2015-10-28 NOTE — ED Notes (Signed)
Pt given breakfast ,has no c/o at this time

## 2015-10-28 NOTE — ED Notes (Addendum)
Pt on phone and then wants to shower

## 2015-10-28 NOTE — ED Notes (Signed)

## 2016-03-07 ENCOUNTER — Ambulatory Visit: Payer: Medicaid Other | Admitting: Family Medicine

## 2016-03-12 ENCOUNTER — Emergency Department
Admission: EM | Admit: 2016-03-12 | Discharge: 2016-03-12 | Disposition: A | Discharge status: Home / Self Care | Payer: Medicaid Other | Attending: Emergency Medicine | Admitting: Emergency Medicine

## 2016-03-12 ENCOUNTER — Encounter: Payer: Self-pay | Admitting: Medical Oncology

## 2016-03-12 DIAGNOSIS — Z79899 Other long term (current) drug therapy: Secondary | ICD-10-CM | POA: Insufficient documentation

## 2016-03-12 DIAGNOSIS — Y999 Unspecified external cause status: Secondary | ICD-10-CM | POA: Insufficient documentation

## 2016-03-12 DIAGNOSIS — S29019A Strain of muscle and tendon of unspecified wall of thorax, initial encounter: Secondary | ICD-10-CM | POA: Insufficient documentation

## 2016-03-12 DIAGNOSIS — Z792 Long term (current) use of antibiotics: Secondary | ICD-10-CM | POA: Insufficient documentation

## 2016-03-12 DIAGNOSIS — Y92410 Unspecified street and highway as the place of occurrence of the external cause: Secondary | ICD-10-CM | POA: Insufficient documentation

## 2016-03-12 DIAGNOSIS — Y939 Activity, unspecified: Secondary | ICD-10-CM | POA: Insufficient documentation

## 2016-03-12 DIAGNOSIS — S161XXA Strain of muscle, fascia and tendon at neck level, initial encounter: Secondary | ICD-10-CM | POA: Insufficient documentation

## 2016-03-12 DIAGNOSIS — F17200 Nicotine dependence, unspecified, uncomplicated: Secondary | ICD-10-CM | POA: Insufficient documentation

## 2016-03-12 MED ORDER — TRAMADOL HCL 50 MG PO TABS
50.0000 mg | ORAL_TABLET | Freq: Once | ORAL | Status: AC
  Administered 2016-03-12: 50 mg via ORAL
  Filled 2016-03-12: qty 1

## 2016-03-12 MED ORDER — IBUPROFEN 800 MG PO TABS
800.0000 mg | ORAL_TABLET | Freq: Once | ORAL | Status: AC
  Administered 2016-03-12: 800 mg via ORAL
  Filled 2016-03-12: qty 1

## 2016-03-12 MED ORDER — TRAMADOL HCL 50 MG PO TABS: 50.0000 mg | ORAL_TABLET | Freq: Four times a day (QID) | ORAL | Status: AC | PRN

## 2016-03-12 MED ORDER — IBUPROFEN 800 MG PO TABS: 800.0000 mg | ORAL_TABLET | Freq: Three times a day (TID) | ORAL | Status: DC | PRN

## 2016-03-12 MED ORDER — METHOCARBAMOL 750 MG PO TABS: 750.0000 mg | ORAL_TABLET | Freq: Four times a day (QID) | ORAL | Status: DC

## 2016-03-12 MED ORDER — METHOCARBAMOL 500 MG PO TABS
1000.0000 mg | ORAL_TABLET | Freq: Once | ORAL | Status: AC
  Administered 2016-03-12: 1000 mg via ORAL
  Filled 2016-03-12: qty 2
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
## 2016-03-12 NOTE — ED Notes (Signed)
[facility] since then she has been having lower back pain and neck pain.

## 2016-03-12 NOTE — Discharge Instructions (Signed)
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
## 2017-10-23 ENCOUNTER — Encounter: Payer: Self-pay | Admitting: Emergency Medicine

## 2017-10-23 ENCOUNTER — Emergency Department: Payer: Self-pay

## 2017-10-23 ENCOUNTER — Other Ambulatory Visit: Payer: Self-pay

## 2017-10-23 ENCOUNTER — Emergency Department
Admission: EM | Admit: 2017-10-23 | Discharge: 2017-10-23 | Disposition: A | Discharge status: Home / Self Care | Payer: Self-pay | Attending: Emergency Medicine | Admitting: Emergency Medicine

## 2017-10-23 DIAGNOSIS — R059 Cough, unspecified: Secondary | ICD-10-CM

## 2017-10-23 DIAGNOSIS — Z79899 Other long term (current) drug therapy: Secondary | ICD-10-CM | POA: Insufficient documentation

## 2017-10-23 DIAGNOSIS — R51 Headache: Secondary | ICD-10-CM | POA: Insufficient documentation

## 2017-10-23 DIAGNOSIS — F17210 Nicotine dependence, cigarettes, uncomplicated: Secondary | ICD-10-CM | POA: Insufficient documentation

## 2017-10-23 DIAGNOSIS — R05 Cough: Secondary | ICD-10-CM | POA: Insufficient documentation

## 2017-10-23 MED ORDER — BENZONATATE 100 MG PO CAPS: 100.0000 mg | ORAL_CAPSULE | Freq: Three times a day (TID) | ORAL | 0 refills | Status: DC | PRN

## 2017-10-23 NOTE — Discharge Instructions (Signed)

## 2017-10-23 NOTE — ED Provider Notes (Signed)
[facility] Emergency Department Provider Note  ____________________________________________   First MD Initiated Contact with Patient 10/23/17 [phone]     (approximate)  I have reviewed the triage vital signs and the nursing notes.   HISTORY  Chief Complaint Headache and Cough    HPI Rebekah Dennis is a 21 y.o. female with no contributory chronic medical history who presents for evaluation of acute onset severe cough.  She states that she has been feeling okay, possibly a little bit congested today.  Her boyfriend recently had a viral illness with similar symptoms.  Tonight when she went to bed she began coughing severely which made her chest and back hurt.  Because she could not stop coughing she felt she should come to the emergency department for evaluation because it was also affecting her sleep.  She denies fever/chills, nausea, vomiting, abdominal pain, and dysuria.  When she got to the emergency department her cough improved and she has been asleep in the exam room.  She is in no acute distress.  Even though she was coughing she has not had any shortness of breath to speak of.  Nothing in particular made her symptoms better nor worse.  Past Medical History:  Diagnosis Date  . Chronic dermatitis     Patient Active Problem List   Diagnosis Date Noted  . 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 Marijuana abuse 10/28/2015  . Alcohol abuse 10/28/2015  . Suicidal ideation 10/28/2015  . Chronic dermatitis 08/18/2015    History reviewed. No pertinent surgical history.  Prior to Admission medications   Medication Sig Start Date End Date Taking? Authorizing Provider  benzonatate (TESSALON PERLES) 100 MG capsule Take 1 capsule (100 mg total) by mouth 3 (three) times daily as needed for cough. 10/23/17   Loleta RoseForbach, Ilhan Madan, MD  ciprofloxacin (CIPRO) 250 MG tablet Take 1 tablet (250 mg total) by mouth 2 (two) times daily. 08/18/15   Alba CorySowles,  Krichna, MD  Etonogestrel (NEXPLANON [place]) Inject 1 Units into the skin once. 12/15/14 12/15/17  [provider]  ibuprofen (ADVIL,MOTRIN) 800 MG tablet Take 1 tablet (800 mg total) by mouth every 8 (eight) hours as needed. 03/12/16   Joni ReiningSmith, Ronald K, PA-C  methocarbamol (ROBAXIN-750) 750 MG tablet Take 1 tablet (750 mg total) by mouth 4 (four) times daily. 03/12/16   Joni ReiningSmith, Ronald K, PA-C    Allergies Patient has no known allergies.  No family history on file.  Social History Social History   Tobacco Use  . Smoking status: Current Every Day Smoker  . Smokeless tobacco: Never Used  Substance Use Topics  . Alcohol use: Yes    Alcohol/week: 0.0 oz    Comment: occasional  . Drug use: No    Review of Systems Constitutional: No fever/chills Cardiovascular: Chest wall tenderness associated with cough Respiratory: Severe cough now improved . denies shortness of breath. Gastrointestinal: No abdominal pain.  No nausea, no vomiting.  Musculoskeletal: Negative for neck pain.  Some back pain associated with cough Integumentary: Negative for rash. Neurological: Negative for headaches, focal weakness or numbness.   ____________________________________________   PHYSICAL EXAM:  VITAL SIGNS: ED Triage Vitals [10/23/17 0206]  Enc Vitals Group     BP (!) 132/91     Pulse Rate 99     Resp 18     Temp 98.2 F (36.8 C)     Temp Source Oral     SpO2 100 %     Weight 61.2 kg (  135 lb)     Height 1.753 m (5\' 9" )     Head Circumference      Peak Flow      Pain Score 7     Pain Loc      Pain Edu?      Excl. in GC?     Constitutional: Alert and oriented. Well appearing and in no acute distress. Eyes: Conjunctivae are normal.  Head: Atraumatic. Nose: No congestion/rhinnorhea. Mouth/Throat: Mucous membranes are moist. Cardiovascular: Normal rate, regular rhythm. Good peripheral circulation. Grossly normal heart sounds. Respiratory: Normal respiratory effort.  No retractions.  Lungs CTAB. Musculoskeletal: No lower extremity tenderness nor edema. No gross deformities of extremities. Neurologic:  Normal speech and language. No gross focal neurologic deficits are appreciated.  Skin:  Skin is warm, dry and intact. No rash noted. Psychiatric: Mood and affect are normal. Speech and behavior are normal.  ____________________________________________   LABS (all labs ordered are listed, but only abnormal results are displayed)  Labs Reviewed - No data to display ____________________________________________  EKG  None - EKG not ordered by ED physician ____________________________________________  RADIOLOGY   Dg Chest 2 View  Result Date: 10/23/2017 CLINICAL DATA:  Acute onset of productive cough and frontal headache. EXAM: CHEST  2 VIEW COMPARISON:  Chest radiograph performed 10/27/2015 FINDINGS: The lungs are well-aerated and clear. There is no evidence of focal opacification, pleural effusion or pneumothorax. The heart is normal in size; the mediastinal contour is within normal limits. No acute osseous abnormalities are seen. IMPRESSION: No acute cardiopulmonary process seen. Electronically Signed   By: Roanna RaiderJeffery  Chang M.D.   On: 10/23/2017 04:39    ____________________________________________   PROCEDURES  Critical Care performed: No   Procedure(s) performed:   Procedures   ____________________________________________   INITIAL IMPRESSION / ASSESSMENT AND PLAN / ED COURSE  As part of my medical decision making, I reviewed the following data within the electronic MEDICAL RECORD NUMBER Nursing notes reviewed and incorporated    Differential diagnosis includes, but is not limited to, viral syndrome, community-acquired pneumonia, less likely pulmonary embolism or ACS.  The patient is in no distress and has been sleeping comfortably.  Chest x-ray is clear.  Physical exam is unremarkable and vital signs are normal. PERC negative.  She made the statement "I  think I have a common cold".  I agreed, recommended ibuprofen and Tylenol, will write prescription for Tessalon, gave usual and customary return precautions.    ____________________________________________  FINAL CLINICAL IMPRESSION(S) / ED DIAGNOSES  Final diagnoses:  Cough     MEDICATIONS GIVEN DURING THIS VISIT:  Medications - No data to display   ED Discharge Orders        Ordered    benzonatate (TESSALON PERLES) 100 MG capsule  3 times daily PRN     10/23/17 0534       Note:  This document was prepared using Dragon voice recognition software and may include unintentional dictation errors.    Loleta RoseForbach, Mennie Spiller, MD 10/23/17 [phone]

## 2017-10-23 NOTE — ED Notes (Signed)
Dr Forbach at the bedside. 

## 2017-10-23 NOTE — ED Triage Notes (Signed)
Patient ambulatory to triage with steady gait, without difficulty or distress noted; pt reports onset prod cough clear sputum tonight with no fever; also reports frontal HA

## 2017-12-31 LAB — HM PAP SMEAR: HM Pap smear: NEGATIVE

## 2018-08-08 IMAGING — CR DG CHEST 2V
1 series · 2 of 2 positions shown · non-contrast
Comparison: Chest radiograph performed 10/27/2015

CLINICAL DATA: Acute onset of productive cough and frontal
headache.

EXAM:
CHEST  2 VIEW

[Series 1: dg chest 2 view · 0.14mm/px · 2 of 2 slices shown]
[im 1/2]
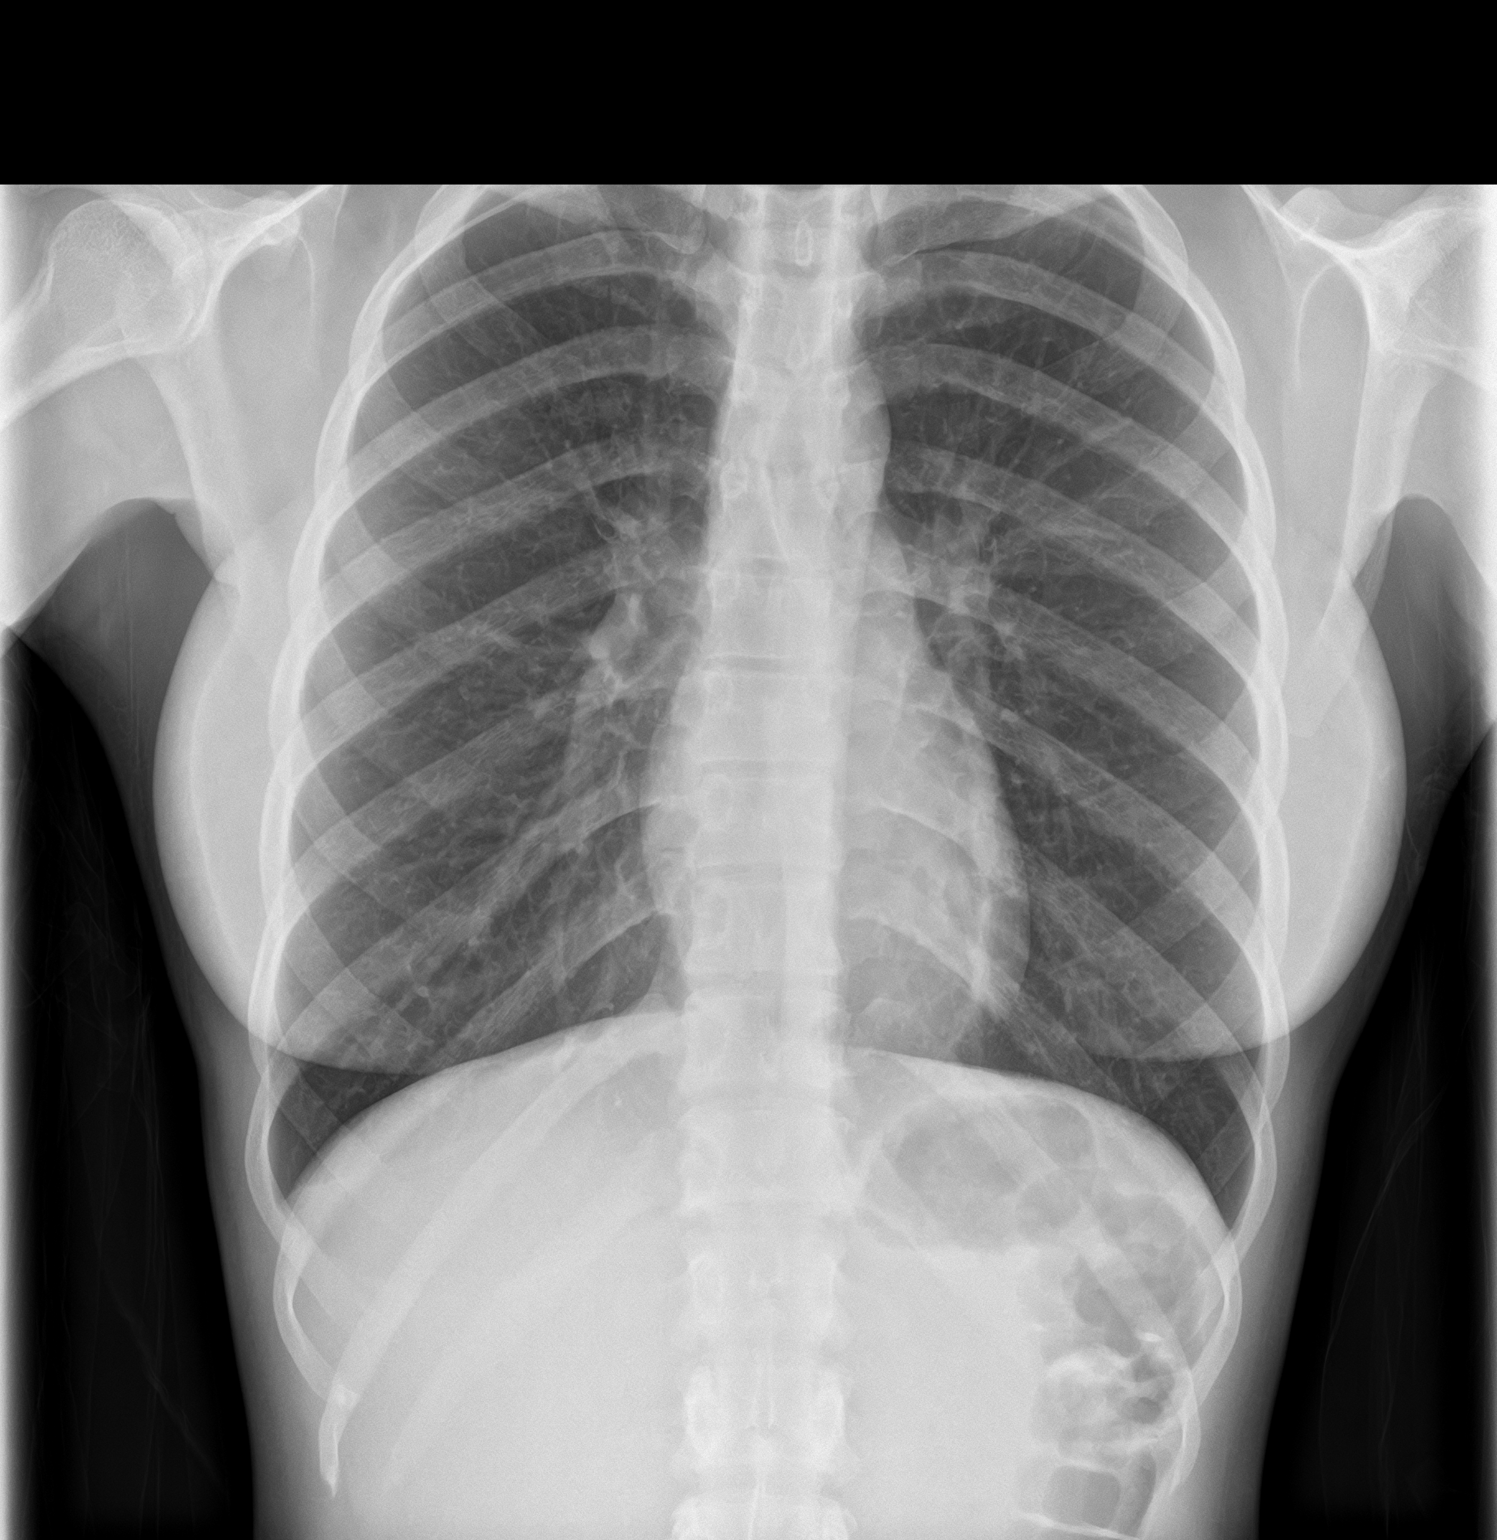
[im 2/2]
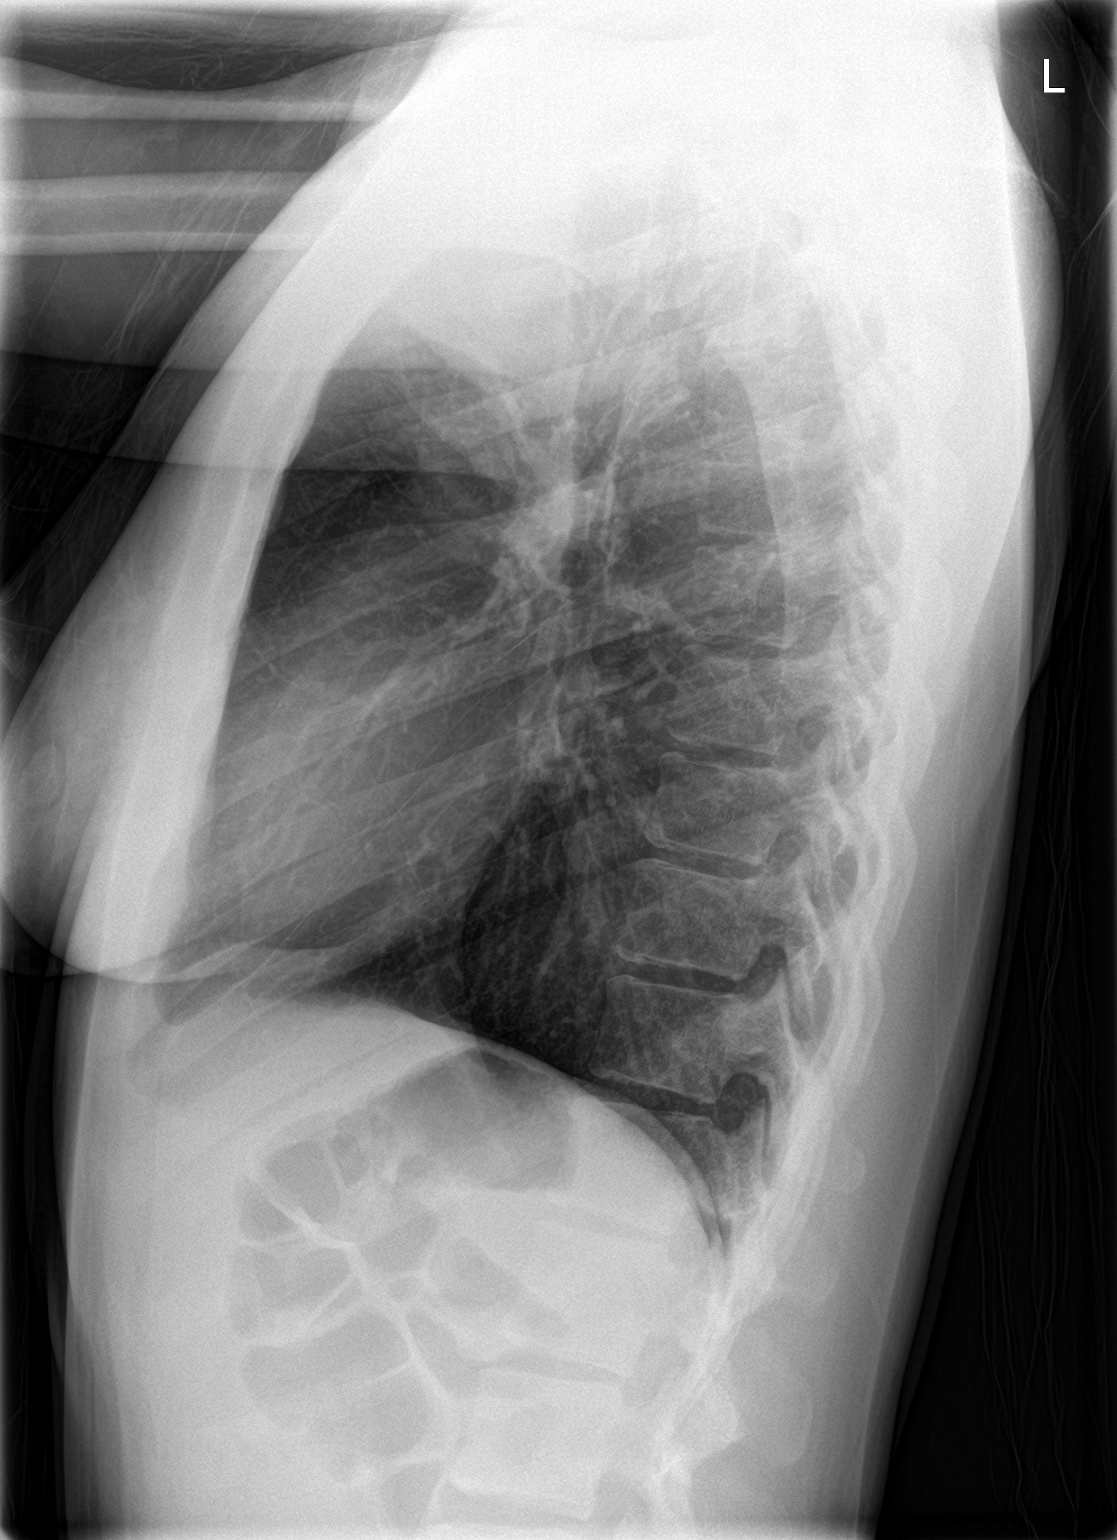

[2 of 2 positions shown; findings below may reference images not displayed]

FINDINGS: The lungs are well-aerated and clear. There is no evidence of focal
opacification, pleural effusion or pneumothorax.

The heart is normal in size; the mediastinal contour is within
normal limits. No acute osseous abnormalities are seen.
IMPRESSION: No acute cardiopulmonary process seen.

## 2018-09-12 ENCOUNTER — Encounter: Payer: Self-pay | Admitting: *Deleted

## 2018-09-12 ENCOUNTER — Other Ambulatory Visit: Payer: Self-pay

## 2018-09-12 ENCOUNTER — Emergency Department
Admission: EM | Admit: 2018-09-12 | Discharge: 2018-09-12 | Disposition: A | Discharge status: Home / Self Care | Payer: Self-pay | Attending: Student in an Organized Health Care Education/Training Program | Admitting: Student in an Organized Health Care Education/Training Program

## 2018-09-12 DIAGNOSIS — Z79899 Other long term (current) drug therapy: Secondary | ICD-10-CM | POA: Insufficient documentation

## 2018-09-12 DIAGNOSIS — J029 Acute pharyngitis, unspecified: Secondary | ICD-10-CM | POA: Insufficient documentation

## 2018-09-12 DIAGNOSIS — F17200 Nicotine dependence, unspecified, uncomplicated: Secondary | ICD-10-CM | POA: Insufficient documentation

## 2018-09-12 LAB — GROUP A STREP BY PCR: Group A Strep by PCR: NOT DETECTED

## 2018-09-12 MED ORDER — PSEUDOEPH-BROMPHEN-DM 30-2-10 MG/5ML PO SYRP: 10.0000 mL | ORAL_SOLUTION | Freq: Four times a day (QID) | ORAL | 0 refills | Status: DC | PRN

## 2018-09-12 MED ORDER — MAGIC MOUTHWASH W/LIDOCAINE: 5.0000 mL | Freq: Four times a day (QID) | ORAL | 0 refills | Status: DC

## 2018-09-12 NOTE — ED Provider Notes (Signed)
[facility] Emergency Department Provider Note  ____________________________________________  Time seen: Approximately 3:21 PM  I have reviewed the triage vital signs and the nursing notes.   HISTORY  Chief Complaint Sore Throat    HPI Rebekah Dennis is a 22 y.o. female who presents the emergency department complaining of sore throat and cough.  Patient reports 2 to 3-day history of sore throat, nonproductive cough.  Patient reports that her niece who has been sleeping with her, has had URI symptoms.  Patient denies any fevers or chills, nasal congestion, ear pain, difficulty breathing or swallowing, chest pain, abdominal pain, nausea or vomiting, diarrhea or constipation.  Patient has used cough drops but no other medications for this complaint.  No other complaints at this time.    Past Medical History:  Diagnosis Date  . Chronic dermatitis     Patient Active Problem List   Diagnosis Date Noted  . 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 Marijuana abuse 10/28/2015  . Alcohol abuse 10/28/2015  . Suicidal ideation 10/28/2015  . Chronic dermatitis 08/18/2015    History reviewed. No pertinent surgical history.  Prior to Admission medications   Medication Sig Start Date End Date Taking? Authorizing Provider  benzonatate (TESSALON PERLES) 100 MG capsule Take 1 capsule (100 mg total) by mouth 3 (three) times daily as needed for cough. 10/23/17   Loleta RoseForbach, Cory, MD  brompheniramine-pseudoephedrine-DM 30-2-10 MG/5ML syrup Take 10 mLs by mouth 4 (four) times daily as needed. 09/12/18   , Delorise RoyalsJonathan D, PA-C  ciprofloxacin (CIPRO) 250 MG tablet Take 1 tablet (250 mg total) by mouth 2 (two) times daily. 08/18/15   Alba CorySowles, Krichna, MD  Etonogestrel (NEXPLANON [place]) Inject 1 Units into the skin once. 12/15/14 12/15/17  [provider]  ibuprofen (ADVIL,MOTRIN) 800 MG tablet Take 1 tablet (800 mg total) by mouth every 8 (eight) hours as  needed. 03/12/16   Joni ReiningSmith, Ronald K, PA-C  magic mouthwash w/lidocaine SOLN Take 5 mLs by mouth 4 (four) times daily. 09/12/18   , Delorise RoyalsJonathan D, PA-C  methocarbamol (ROBAXIN-750) 750 MG tablet Take 1 tablet (750 mg total) by mouth 4 (four) times daily. 03/12/16   Joni ReiningSmith, Ronald K, PA-C    Allergies Patient has no known allergies.  History reviewed. No pertinent family history.  Social History Social History   Tobacco Use  . Smoking status: Current Every Day Smoker  . Smokeless tobacco: Never Used  Substance Use Topics  . Alcohol use: Yes    Alcohol/week: 0.0 standard drinks    Comment: occasional  . Drug use: No     Review of Systems  Constitutional: No fever/chills Eyes: No visual changes. No discharge ENT: Positive for sore throat Cardiovascular: no chest pain. Respiratory: Positive cough. No SOB. Gastrointestinal: No abdominal pain.  No nausea, no vomiting.  Musculoskeletal: Negative for musculoskeletal pain. Skin: Negative for rash, abrasions, lacerations, ecchymosis. Neurological: Negative for headaches, focal weakness or numbness. 10-point ROS otherwise negative.  ____________________________________________   PHYSICAL EXAM:  VITAL SIGNS: ED Triage Vitals  Enc Vitals Group     BP 09/12/18 1502 136/77     Pulse Rate 09/12/18 1502 99     Resp 09/12/18 1502 16     Temp 09/12/18 1502 98.1 F (36.7 C)     Temp Source 09/12/18 1502 Oral     SpO2 09/12/18 1502 100 %     Weight 09/12/18 1503 130 lb (59 kg)     Height 09/12/18 1503 5\' 9"  (1.753  m)     Head Circumference --      Peak Flow --      Pain Score 09/12/18 1503 8     Pain Loc --      Pain Edu? --      Excl. in GC? --      Constitutional: Alert and oriented. Well appearing and in no acute distress. Eyes: Conjunctivae are normal. PERRL. EOMI. Head: Atraumatic. ENT:      Ears: EACs unremarkable bilaterally.  TMs are minimally bulging bilaterally.      Nose: Minimal clear congestion/rhinnorhea.   Turbinates are erythematous      Mouth/Throat: Mucous membranes are moist.  Oropharynx is erythematous but nonedematous.  Tonsils are erythematous but nonedematous.  No exudates.  Uvula is midline. Neck: No stridor.  Neck is supple full range of motion Hematological/Lymphatic/Immunilogical: No cervical lymphadenopathy. Cardiovascular: Normal rate, regular rhythm. Normal S1 and S2.  Good peripheral circulation. Respiratory: Normal respiratory effort without tachypnea or retractions. Lungs CTAB. Good air entry to the bases with no decreased or absent breath sounds. Musculoskeletal: Full range of motion to all extremities. No gross deformities appreciated. Neurologic:  Normal speech and language. No gross focal neurologic deficits are appreciated.  Skin:  Skin is warm, dry and intact. No rash noted. Psychiatric: Mood and affect are normal. Speech and behavior are normal. Patient exhibits appropriate insight and judgement.   ____________________________________________   LABS (all labs ordered are listed, but only abnormal results are displayed)  Labs Reviewed  GROUP A STREP BY PCR   ____________________________________________  EKG   ____________________________________________  RADIOLOGY   No results found.  ____________________________________________    PROCEDURES  Procedure(s) performed:    Procedures    Medications - No data to display   ____________________________________________   INITIAL IMPRESSION / ASSESSMENT AND PLAN / ED COURSE  Pertinent labs & imaging results that were available during my care of the patient were reviewed by me and considered in my medical decision making (see chart for details).  Review of the [place] CSRS was performed in accordance of the [facility] prior to dispensing any controlled drugs.      Patient's diagnosis is consistent with viral pharyngitis.  Patient presents the emergency department with complaints of sore throat and cough.   No fevers or chills, nasal congestion, productive cough.  Exam was overall reassuring.  Patient does not meet [facility] criteria for strep.  Lungs were clear with no indication of bronchitis or pneumonia.  Diagnosis most consistent with viral pharyngitis.  Patient will be given symptom control medications of Magic mouthwash and Bromfed cough syrup.  Tylenol/Motrin at home as needed.  Follow-up with primary care as needed. Patient is given ED precautions to return to the ED for any worsening or new symptoms.     ____________________________________________  FINAL CLINICAL IMPRESSION(S) / ED DIAGNOSES  Final diagnoses:  Viral pharyngitis      NEW MEDICATIONS STARTED DURING THIS VISIT:  ED Discharge Orders         Ordered    magic mouthwash w/lidocaine SOLN  4 times daily    Note to Pharmacy:  Dispense in a 1/1/1 ratio. Use lidocaine, diphenhydramine, prednisolone   09/12/18 1533    brompheniramine-pseudoephedrine-DM 30-2-10 MG/5ML syrup  4 times daily PRN     09/12/18 1533              This chart was dictated using voice recognition software/Dragon. Despite best efforts to proofread, errors can occur which can change the meaning.  Any change was purely unintentional.    Racheal Patches, PA-C 09/12/18 1534    Willy Eddy, MD 09/12/18 1725

## 2018-09-12 NOTE — ED Triage Notes (Signed)
Pt to ED reporting sore throat that has worsened over the past two days without fever. Voice is a little horse but no SOB or difficulty breathing.

## 2018-09-30 LAB — HM HIV SCREENING LAB: HM HIV Screening: NEGATIVE

## 2019-05-29 ENCOUNTER — Encounter: Payer: Self-pay | Admitting: Physician Assistant

## 2019-05-29 ENCOUNTER — Other Ambulatory Visit: Payer: Self-pay

## 2019-05-29 ENCOUNTER — Ambulatory Visit: Payer: Self-pay | Admitting: Physician Assistant

## 2019-05-29 DIAGNOSIS — Z113 Encounter for screening for infections with a predominantly sexual mode of transmission: Secondary | ICD-10-CM

## 2019-05-29 DIAGNOSIS — N760 Acute vaginitis: Secondary | ICD-10-CM

## 2019-05-29 DIAGNOSIS — B9689 Other specified bacterial agents as the cause of diseases classified elsewhere: Secondary | ICD-10-CM

## 2019-05-29 LAB — WET PREP FOR TRICH, YEAST, CLUE
Trichomonas Exam: NEGATIVE
Yeast Exam: NEGATIVE

## 2019-05-29 MED ORDER — METRONIDAZOLE 500 MG PO TABS: 500.0000 mg | ORAL_TABLET | Freq: Two times a day (BID) | ORAL | 0 refills | Status: DC

## 2019-05-29 NOTE — Progress Notes (Signed)
STI clinic/screening visit  Subjective:  Rebekah Dennis is a 23 y.o. female being seen today for an STI screening visit. The patient reports they do have symptoms.  Patient has the following medical conditions:   Patient Active Problem List   Diagnosis Date Noted  . 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 Marijuana abuse 10/28/2015  . Alcohol abuse 10/28/2015  . Suicidal ideation 10/28/2015  . Chronic dermatitis 08/18/2015     Chief Complaint  Patient presents with  . SEXUALLY TRANSMITTED DISEASE    HPI  Patient reports that she had pain with sex 3 days ago, has had bleeding for 2 weeks and a "sore" feeling in vagina since having sex.  Denies other symptoms or concerns.  Uses Depo for [facility] and is due for next injection in 2-3 weeks.   See flowsheet for further details and programmatic requirements.    The following portions of the patient's history were reviewed and updated as appropriate: allergies, current medications, past medical history, past social history, past surgical history and problem list.  Objective:  There were no vitals filed for this visit.  Physical Exam Constitutional:      General: She is not in acute distress.    Appearance: She is normal weight.  HENT:     Head: Normocephalic and atraumatic.     Mouth/Throat:     Mouth: Mucous membranes are moist.     Pharynx: Oropharynx is clear. No oropharyngeal exudate or posterior oropharyngeal erythema.  Neck:     Musculoskeletal: Neck supple.  Pulmonary:     Effort: Pulmonary effort is normal.  Abdominal:     Palpations: Abdomen is soft. There is no mass.     Tenderness: There is no abdominal tenderness. There is no guarding or rebound.  Genitourinary:    General: Normal vulva.     Rectum: Normal.     Comments: External genitalia/pubic area without nits, lice, edema, erythema, lesions and inguinal adenopathy. Vagina with normal mucosa and moderate amount of menstrual bleeding.   Cervix without visible lesions. Uterus normal size, firm, mobile, nt, no masses, no CMT, no adnexal tenderness or fullness.  Lymphadenopathy:     Cervical: No cervical adenopathy.  Skin:    General: Skin is warm and dry.     Findings: No bruising, erythema, lesion or rash.  Neurological:     Mental Status: She is alert and oriented to person, place, and time.  Psychiatric:        Mood and Affect: Mood normal.        Behavior: Behavior normal.        Thought Content: Thought content normal.        Judgment: Judgment normal.       Assessment and Plan:  Rebekah Dennis is a 23 y.o. female presenting to the [facility] Department for STI screening  1. Screening for STD (sexually transmitted disease) Patient with concern about pain with sex and irregular bleeding. Rec condoms with all sex. Await test results.  Counseled that RN will call if needs to RTC for any further treatment after results are back.  - WET PREP FOR [place], YEAST, [facility]  2. BV (bacterial vaginosis) Will treat for BV with Metronidazole 500 mg #14 1 po BID for 7 days with food, no EtOH for 24 hr before and until 72 hr after completing medicine. No sex for 7 days. - metroNIDAZOLE (FLAGYL) 500 MG tablet; Take 1  tablet (500 mg total) by mouth 2 (two) times daily.  Dispense: 14 tablet; Refill: 0     No follow-ups on file.  No future appointments.  Jerene Dilling, PA

## 2019-05-29 NOTE — Progress Notes (Signed)
Here today for STD screening. Declines bloodwork. Tajon Moring, RN ? ?

## 2019-06-09 ENCOUNTER — Telehealth: Payer: Self-pay | Admitting: Physician Assistant

## 2019-06-09 NOTE — Telephone Encounter (Signed)
Call to patient re:  Results from 05/29/19 STD screening visit.  Identified patient with DOB and password.  Counseled patient that she needs treatment for Chlamydia.  Patient states that she has appt tomorrow for Depo and asks if she can get treated when she is here for that.  Counseled patient that she can get treated then and recommend that she eat prior to coming for her appt to reduce chances for nausea and vomiting after taking medication.  Patient verbalizes understanding and agrees with plan.

## 2019-06-10 ENCOUNTER — Other Ambulatory Visit: Payer: Self-pay

## 2019-06-10 ENCOUNTER — Ambulatory Visit (LOCAL_COMMUNITY_HEALTH_CENTER): Payer: Self-pay

## 2019-06-10 VITALS — BP 110/70 | Ht 69.0 in | Wt 139.5 lb

## 2019-06-10 DIAGNOSIS — A749 Chlamydial infection, unspecified: Secondary | ICD-10-CM

## 2019-06-10 DIAGNOSIS — Z3009 Encounter for other general counseling and advice on contraception: Secondary | ICD-10-CM

## 2019-06-10 DIAGNOSIS — Z30013 Encounter for initial prescription of injectable contraceptive: Secondary | ICD-10-CM

## 2019-06-10 MED ORDER — MEDROXYPROGESTERONE ACETATE 150 MG/ML IM SUSP
150.0000 mg | Freq: Once | INTRAMUSCULAR | Status: AC
  Administered 2019-06-10: 150 mg via INTRAMUSCULAR

## 2019-06-10 MED ORDER — MULTI-VITAMIN/MINERALS PO TABS: 1.0000 | ORAL_TABLET | Freq: Every day | ORAL | 0 refills | Status: DC

## 2019-06-10 MED ORDER — AZITHROMYCIN 500 MG PO TABS
500.0000 mg | ORAL_TABLET | Freq: Once | ORAL | Status: AC
Start: 2019-06-10 — End: 2019-06-10
  Administered 2019-06-10: 1000 mg via ORAL

## 2019-06-10 NOTE — Progress Notes (Signed)
Client presents to Nurse Clinic for Depo and chlamydia treatment. As physical due 12/2018 and MD standing order utilized for Depo 02/2019, consult with Antoine Primas PA. Per her verbal order, may administer Depo 150 mg IM today. Client tolerated Depo injection without complaint. Client aware when calls for Depo appt in October to ask for physical appt and if agency not yet doing physicals, to ask for appt with provider to discuss BCM. Client reports onset of menses mid July 2020 and reports is still bleeding. Client does state bleeding started out heavy and is now light in amount. Shona Needles, RN

## 2019-10-01 ENCOUNTER — Other Ambulatory Visit: Payer: Self-pay

## 2019-10-01 DIAGNOSIS — Z20822 Contact with and (suspected) exposure to covid-19: Secondary | ICD-10-CM

## 2019-10-05 LAB — NOVEL CORONAVIRUS, NAA: SARS-CoV-2, NAA: NOT DETECTED

## 2019-10-30 NOTE — L&D Delivery Note (Signed)
Delivery Note At 0601, a viable female was delivered   vaginally (Presentation:  OA    ).  APGAR:7 9, ; weight  pending.   Placenta status:  delivered intact at 0609,  .  Cord:3 vessel   with the following complications:  none.  Cord pH: NA  Anesthesia:  epidural  Episiotomy:   Lacerations:1st degree perineal   Suture Repair: 3.0 vicryl rapide Est. Blood Loss (mL):  400ccs.  Patient pushed for over 2 hours with slow descent of the fetal head. Slow presentation, OA, with restitution to ROT. Anterior should delivered with gentle downward traction; rest of the body followed slowly. spontnaeous respirations and excellent heart rate noted. Some decreased tone observed, and infant was placed on the maternal abdomen for tactile stimulation and drying.  Cord was cut once no longer pulsing.  Mom to postpartum.  Baby to Couplet care / Skin to Skin.  Mirna Mires 07/26/2020, 6:39 AM

## 2020-01-04 ENCOUNTER — Other Ambulatory Visit: Payer: Self-pay

## 2020-01-04 ENCOUNTER — Ambulatory Visit (LOCAL_COMMUNITY_HEALTH_CENTER): Payer: Self-pay

## 2020-01-04 VITALS — BP 135/72 | Ht 69.0 in | Wt 149.5 lb

## 2020-01-04 DIAGNOSIS — Z3201 Encounter for pregnancy test, result positive: Secondary | ICD-10-CM

## 2020-01-04 LAB — PREGNANCY, URINE: Preg Test, Ur: POSITIVE — AB

## 2020-01-04 MED ORDER — PRENATAL VITAMIN 27-0.8 MG PO TABS
1.0000 | ORAL_TABLET | Freq: Every day | ORAL | 0 refills | Status: DC
Start: 2020-01-04 — End: 2020-01-25

## 2020-01-04 NOTE — Progress Notes (Signed)
Unsure of [facility] provider (considering [facility] as already has care established at practice). Jossie Ng, RN

## 2020-01-20 ENCOUNTER — Other Ambulatory Visit (HOSPITAL_COMMUNITY)
Admission: RE | Admit: 2020-01-20 | Discharge: 2020-01-20 | Disposition: A | Discharge status: Home / Self Care | Payer: Medicaid Other | Source: Ambulatory Visit | Attending: Obstetrics and Gynecology | Admitting: Obstetrics and Gynecology

## 2020-01-20 ENCOUNTER — Other Ambulatory Visit: Payer: Self-pay

## 2020-01-20 ENCOUNTER — Ambulatory Visit (INDEPENDENT_AMBULATORY_CARE_PROVIDER_SITE_OTHER): Payer: Medicaid Other | Admitting: Obstetrics and Gynecology

## 2020-01-20 ENCOUNTER — Encounter: Payer: Self-pay | Admitting: Obstetrics and Gynecology

## 2020-01-20 VITALS — BP 118/74 | Wt 155.0 lb

## 2020-01-20 DIAGNOSIS — Z113 Encounter for screening for infections with a predominantly sexual mode of transmission: Secondary | ICD-10-CM

## 2020-01-20 DIAGNOSIS — Z3480 Encounter for supervision of other normal pregnancy, unspecified trimester: Secondary | ICD-10-CM

## 2020-01-20 DIAGNOSIS — Z3402 Encounter for supervision of normal first pregnancy, second trimester: Secondary | ICD-10-CM

## 2020-01-20 DIAGNOSIS — Z3A20 20 weeks gestation of pregnancy: Secondary | ICD-10-CM

## 2020-01-20 DIAGNOSIS — Z3689 Encounter for other specified antenatal screening: Secondary | ICD-10-CM

## 2020-01-20 DIAGNOSIS — Z124 Encounter for screening for malignant neoplasm of cervix: Secondary | ICD-10-CM | POA: Diagnosis not present

## 2020-01-20 NOTE — Progress Notes (Signed)
NOB today. LMP she thinks was 08/29/2019, was having irregular cycles.

## 2020-01-20 NOTE — Progress Notes (Signed)
New Obstetric Patient H&P    Chief Complaint: "Desires prenatal care"   History of Present Illness: Patient is a 24 y.o. G1P0 Not Hispanic or Latino female, presents with amenorrhea and positive home pregnancy test. Patient's last menstrual period was 08/29/2019 (within days). and based on her  LMP, her EDD is Estimated Date of Delivery: 06/04/20 and her EGA is [redacted]w[redacted]d.  She is uncertain of her last menstrual period. Her last pap smear was 12/31/2017 NIL.    She had a urine preg. Since her LMP she claims she has experienced fatigue. She denies vaginal bleeding. Her past medical history is noncontributory.   Since her LMP, she admits to the use of tobacco products  no  There are cats in the home in the home  no  She admits close contact with children on a regular basis  no  She has had chicken pox in the past yes She has had Tuberculosis exposures, symptoms, or previously tested positive for TB   no Current or past history of domestic violence. no  Genetic Screening/Teratology Counseling: (Includes patient, baby's father, or anyone in either family with:)   34. Patient's age >/= 22 [facility]  no 2. Thalassemia ([place], [place], [place], or Asian background): MCV<80  no 3. Neural tube defect (meningomyelocele, spina bifida, anencephaly)  no 4. Congenital heart defect  no  5. Down syndrome  no 6. Tay-Sachs (Jewish, [place])  no 7. Canavan's Disease  no 8. Sickle cell disease or trait (African)  no  9. Hemophilia or other blood disorders  no  10. Muscular dystrophy  no  11. Cystic fibrosis  no  12. Huntington's Chorea  no  13. Mental retardation/autism  no 14. Other inherited genetic or chromosomal disorder  no 15. Maternal metabolic disorder (DM, PKU, etc)  no 16. Patient or FOB with a child with a birth defect not listed above no  16a. Patient or FOB with a birth defect themselves no 17. Recurrent pregnancy loss, or stillbirth  no  18. Any medications since LMP other than  prenatal vitamins (include vitamins, supplements, OTC meds, drugs, alcohol)  no 19. Any other genetic/environmental exposure to discuss  no  Infection History:   1. Lives with someone with TB or TB exposed  no  2. Patient or partner has history of genital herpes  no 3. Rash or viral illness since LMP  no 4. History of STI (GC, CT, HPV, syphilis, HIV)  no 5. History of recent travel :  no  Other pertinent information:  no     Review of Systems:10 point review of systems negative unless otherwise noted in HPI  Past Medical History:  Patient Active Problem List   Diagnosis Date Noted  . 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 Marijuana abuse 10/28/2015  . Alcohol abuse 10/28/2015  . Suicidal ideation 10/28/2015  . Chronic dermatitis 08/18/2015    Past Surgical History:  Past Surgical History:  Procedure Laterality Date  . Denies surgical history      Gynecologic History: Patient's last menstrual period was 08/29/2019 (within days).  Obstetric History: G1P0  Family History:  Family History  Problem Relation Age of Onset  . Heart attack Maternal Grandfather   . Heart attack Paternal Grandmother   . Stroke Paternal Grandmother     Social History:  Social History   Socioeconomic History  . Marital status: Single    Spouse name: Not on file  . Number of children: Not on file  .  Years of education: Not on file  . Highest education level: Not on file  Occupational History  . Not on file  Tobacco Use  . Smoking status: Former Smoker    Packs/day: 0.25    Years: 9.00    Pack years: 2.25    Types: Cigarettes    Quit date: 01/02/2020    Years since quitting: 0.0  . Smokeless tobacco: Never Used  Substance and Sexual Activity  . Alcohol use: Yes    Alcohol/week: 0.0 standard drinks    Comment: Last ETOH use 01/02/2020  . Drug use: Yes    Types: Marijuana    Comment: Last week use 01/01/2020  . Sexual activity: Yes    Partners: Male    Birth  control/protection: None  Other Topics Concern  . Not on file  Social History Narrative  . Not on file   Social Determinants of Health   Financial Resource Strain:   . Difficulty of Paying Living Expenses:   Food Insecurity:   . Worried About Programme researcher, broadcasting/film/video in the Last Year:   . Barista in the Last Year:   Transportation Needs:   . Freight forwarder (Medical):   Marland Kitchen Lack of Transportation (Non-Medical):   Physical Activity:   . Days of Exercise per Week:   . Minutes of Exercise per Session:   Stress:   . Feeling of Stress :   Social Connections:   . Frequency of Communication with Friends and Family:   . Frequency of Social Gatherings with Friends and Family:   . Attends Religious Services:   . Active Member of Clubs or Organizations:   . Attends Banker Meetings:   Marland Kitchen Marital Status:   Intimate Partner Violence: Not At Risk  . Fear of Current or Ex-Partner: No  . Emotionally Abused: No  . Physically Abused: No  . Sexually Abused: No    Allergies:  Allergies  Allergen Reactions  . Latex     irritation    Medications: Prior to Admission medications   Medication Sig Start Date End Date Taking? Authorizing Provider  Prenatal Vit-Fe Fumarate-FA (PRENATAL VITAMIN) 27-0.8 MG TABS Take 1 tablet by mouth daily at 6 (six) AM. 01/04/20  Yes Federico Flake, MD  metroNIDAZOLE (FLAGYL) 500 MG tablet Take 1 tablet (500 mg total) by mouth 2 (two) times daily. Patient not taking: Reported on 06/10/2019 05/29/19   Matt Holmes, PA  Multiple Vitamins-Minerals (MULTIVITAMIN WITH MINERALS) tablet Take 1 tablet by mouth daily. Patient not taking: Reported on 01/04/2020 06/10/19   Federico Flake, MD    Physical Exam Vitals: Blood pressure 118/74, weight 155 lb (70.3 kg), last menstrual period 08/29/2019.  General: NAD HEENT: normocephalic, anicteric Thyroid: no enlargement, no palpable nodules Pulmonary: No increased work of breathing,  CTAB Cardiovascular: RRR, distal pulses 2+ Abdomen: NABS, soft, non-tender, non-distended.  Umbilicus without lesions.  No hepatomegaly, splenomegaly or masses palpable. No evidence of hernia  Genitourinary:  External: Normal external female genitalia.  Normal urethral meatus, normal  Bartholin's and Skene's glands.    Vagina: Normal vaginal mucosa, no evidence of prolapse.    Cervix: Grossly normal in appearance, no bleeding  Uterus:  Non-enlarged, mobile, normal contour.  No CMT  Adnexa: ovaries non-enlarged, no adnexal masses  Rectal: deferred Extremities: no edema, erythema, or tenderness Neurologic: Grossly intact Psychiatric: mood appropriate, affect full   Assessment: 24 y.o. G1P0 at [redacted]w[redacted]d presenting to initiate prenatal care  Plan: 1) Avoid  alcoholic beverages. 2) Patient encouraged not to smoke.  3) Discontinue the use of all non-medicinal drugs and chemicals.  4) Take prenatal vitamins daily.  5) Nutrition, food safety (fish, cheese advisories, and high nitrite foods) and exercise discussed. 6) Hospital and practice style discussed with cross coverage system.  7) Genetic Screening, such as with 1st Trimester Screening, cell free fetal DNA, AFP testing, and Ultrasound, as well as with amniocentesis and CVS as appropriate, is discussed with patient. At the conclusion of today's visit patient requested genetic testing 8) Dating ultrasound ordered  Vena Austria, MD, [facility], [facility] 01/20/2020, 2:46 PM

## 2020-01-22 LAB — RPR+RH+ABO+RUB AB+AB SCR+CB...
Antibody Screen: NEGATIVE
HIV Screen 4th Generation wRfx: NONREACTIVE
Hematocrit: 38.1 % (ref 34.0–46.6)
Hemoglobin: 12.9 g/dL (ref 11.1–15.9)
Hepatitis B Surface Ag: NEGATIVE
MCH: 31.4 pg (ref 26.6–33.0)
MCHC: 33.9 g/dL (ref 31.5–35.7)
MCV: 93 fL (ref 79–97)
Platelets: 232 10*3/uL (ref 150–450)
RBC: 4.11 x10E6/uL (ref 3.77–5.28)
RDW: 12.1 % (ref 11.7–15.4)
RPR Ser Ql: NONREACTIVE
Rh Factor: POSITIVE
Rubella Antibodies, IGG: 1.49 index (ref >0.99)
Varicella zoster IgG: 246 index (ref >165)
WBC: 7.1 10*3/uL (ref 3.4–10.8)

## 2020-01-22 LAB — URINE CULTURE

## 2020-01-22 LAB — SICKLE CELL SCREEN: Sickle Cell Screen: NEGATIVE

## 2020-01-24 ENCOUNTER — Ambulatory Visit: Payer: Medicaid Other | Attending: Internal Medicine

## 2020-01-24 DIAGNOSIS — Z23 Encounter for immunization: Secondary | ICD-10-CM

## 2020-01-24 NOTE — Progress Notes (Signed)
   Covid-19 Vaccination Clinic  Name:  Rebekah Dennis    MRN: [id number] DOB: 1996/05/13  01/24/2020  Rebekah Dennis was observed post Covid-19 immunization for 15 minutes without incident. She was provided with Vaccine Information Sheet and instruction to access the V-Safe system.   Rebekah Dennis was instructed to call 911 with any severe reactions post vaccine: Marland Kitchen Difficulty breathing  . Swelling of face and throat  . A fast heartbeat  . A bad rash all over body  . Dizziness and weakness   Immunizations Administered    Name Date Dose VIS Date Route   Pfizer COVID-19 Vaccine 01/24/2020  2:06 PM 0.3 mL 10/09/2019 Intramuscular   Manufacturer: [employer], [employer]   Lot: [id number]   NDC: [address]-3

## 2020-01-25 ENCOUNTER — Other Ambulatory Visit: Payer: Self-pay | Admitting: Obstetrics and Gynecology

## 2020-01-25 MED ORDER — VITAFOL GUMMIES 3.33-0.333-34.8 MG PO CHEW: 1.0000 | CHEWABLE_TABLET | Freq: Every day | ORAL | 3 refills | Status: AC

## 2020-01-28 LAB — CYTOLOGY - PAP
Comment: NEGATIVE
Comment: NEGATIVE
Diagnosis: NEGATIVE
HPV 16: NEGATIVE
HPV 18 / 45: NEGATIVE
High risk HPV: POSITIVE — AB

## 2020-02-02 ENCOUNTER — Other Ambulatory Visit: Payer: Self-pay

## 2020-02-02 ENCOUNTER — Encounter: Payer: Self-pay | Admitting: Advanced Practice Midwife

## 2020-02-02 ENCOUNTER — Ambulatory Visit (INDEPENDENT_AMBULATORY_CARE_PROVIDER_SITE_OTHER): Payer: Medicaid Other

## 2020-02-02 ENCOUNTER — Ambulatory Visit (INDEPENDENT_AMBULATORY_CARE_PROVIDER_SITE_OTHER): Payer: Medicaid Other | Admitting: Advanced Practice Midwife

## 2020-02-02 VITALS — BP 122/74 | Wt 152.0 lb

## 2020-02-02 DIAGNOSIS — Z3689 Encounter for other specified antenatal screening: Secondary | ICD-10-CM

## 2020-02-02 DIAGNOSIS — Z3A14 14 weeks gestation of pregnancy: Secondary | ICD-10-CM

## 2020-02-02 DIAGNOSIS — Z3480 Encounter for supervision of other normal pregnancy, unspecified trimester: Secondary | ICD-10-CM

## 2020-02-02 DIAGNOSIS — Z3482 Encounter for supervision of other normal pregnancy, second trimester: Secondary | ICD-10-CM

## 2020-02-02 NOTE — Patient Instructions (Signed)
Exercise During Pregnancy Exercise is an important part of being healthy for people of all ages. Exercise improves the function of your heart and lungs and helps you maintain strength, flexibility, and a healthy body weight. Exercise also boosts energy levels and elevates mood. Most women should exercise regularly during pregnancy. In rare cases, women with certain medical conditions or complications may be asked to limit or avoid exercise during pregnancy. How does this affect me? Along with maintaining general strength and flexibility, exercising during pregnancy can help:  Keep strength in muscles that are used during labor and childbirth.  Decrease low back pain.  Reduce symptoms of depression.  Control weight gain during pregnancy.  Reduce the risk of needing insulin if you develop diabetes during pregnancy.  Decrease the risk of cesarean delivery.  Speed up your recovery after giving birth. How does this affect my baby? Exercise can help you have a healthy pregnancy. Exercise does not cause premature birth. It will not cause your baby to weigh less at birth. What exercises can I do? Many exercises are safe for you to do during pregnancy. Do a variety of exercises that safely increase your heart and breathing rates and help you build and maintain muscle strength. Do exercises exactly as told by your health care provider. You may do these exercises:  Walking or hiking.  Swimming.  Water aerobics.  Riding a stationary bike.  Strength training.  Modified yoga or Pilates. Tell your instructor that you are pregnant. Avoid overstretching, and avoid lying on your back for long periods of time.  Running or jogging. Only choose this type of exercise if you: ? Ran or jogged regularly before your pregnancy. ? Can run or jog and still talk in complete sentences. What exercises should I avoid? Depending on your level of fitness and whether you exercised regularly before your  pregnancy, you may be told to limit high-intensity exercise. You can tell that you are exercising at a high intensity if you are breathing much harder and faster and cannot hold a conversation while exercising. You must avoid:  Contact sports.  Activities that put you at risk for falling on or being hit in the belly, such as downhill skiing, water skiing, surfing, rock climbing, cycling, gymnastics, and horseback riding.  Scuba diving.  Skydiving.  Yoga or Pilates in a room that is heated to high temperatures.  Jogging or running, unless you ran or jogged regularly before your pregnancy. While jogging or running, you should always be able to talk in full sentences. Do not run or jog so fast that you are unable to have a conversation.  Do not exercise at more than 6,000 feet above sea level (high elevation) if you are not used to exercising at high elevation. How do I exercise in a safe way?   Avoid overheating. Do not exercise in very high temperatures.  Wear loose-fitting, breathable clothes.  Avoid dehydration. Drink enough water before, during, and after exercise to keep your urine pale yellow.  Avoid overstretching. Because of hormone changes during pregnancy, it is easy to overstretch muscles, tendons, and ligaments during pregnancy.  Start slowly and ask your health care provider to recommend the types of exercise that are safe for you.  Do not exercise to lose weight. Follow these instructions at home:  Exercise on most days or all days of the week. Try to exercise for 30 minutes a day, 5 days a week, unless your health care provider tells you not to.  If   you actively exercised before your pregnancy and you are healthy, your health care provider may tell you to continue to do moderate to high-intensity exercise.  If you are just starting to exercise or did not exercise much before your pregnancy, your health care provider may tell you to do low to moderate-intensity  exercise. Questions to ask your health care provider  Is exercise safe for me?  What are signs that I should stop exercising?  Does my health condition mean that I should not exercise during pregnancy?  When should I avoid exercising during pregnancy? Stop exercising and contact a health care provider if: You have any unusual symptoms, such as:  Mild contractions of the uterus or cramps in the abdomen.  Dizziness that does not go away when you rest. Stop exercising and get help right away if: You have any unusual symptoms, such as:  Sudden, severe pain in your low back or your belly.  Mild contractions of the uterus or cramps in the abdomen that do not improve with rest and drinking fluids.  Chest pain.  Bleeding or fluid leaking from your vagina.  Shortness of breath. These symptoms may represent a serious problem that is an emergency. Do not wait to see if the symptoms will go away. Get medical help right away. Call your local emergency services (911 in the [place] Do not drive yourself to the hospital. Summary  Most women should exercise regularly throughout pregnancy. In rare cases, women with certain medical conditions or complications may be asked to limit or avoid exercise during pregnancy.  Do not exercise to lose weight during pregnancy.  Your health care provider will tell you what level of physical activity is right for you.  Stop exercising and contact a health care provider if you have mild contractions of the uterus or cramps in the abdomen. Get help right away if these contractions or cramps do not improve with rest and drinking fluids.  Stop exercising and get help right away if you have sudden, severe pain in your low back or belly, chest pain, shortness of breath, or bleeding or leaking of fluid from your vagina. This information is not intended to replace advice given to you by your health care provider. Make sure you discuss any questions you have with your  health care provider. Document Revised: 02/05/2019 Document Reviewed: 11/19/2018 Elsevier Patient Education  2020 [employer] Eating Plan for Pregnant Women While you are pregnant, your body requires additional nutrition to help support your growing baby. You also have a higher need for some vitamins and minerals, such as folic acid, calcium, iron, and vitamin D. Eating a healthy, well-balanced diet is very important for your health and your baby's health. Your need for extra calories varies for the three 3-month segments of your pregnancy (trimesters). For most women, it is recommended to consume:  150 extra calories a day during the first trimester.  300 extra calories a day during the second trimester.  300 extra calories a day during the third trimester. What are tips for following this plan?   Do not try to lose weight or go on a diet during pregnancy.  Limit your overall intake of foods that have "empty calories." These are foods that have little nutritional value, such as sweets, desserts, candies, and sugar-sweetened beverages.  Eat a variety of foods (especially fruits and vegetables) to get a full range of vitamins and minerals.  Take a prenatal vitamin to help meet your additional vitamin and mineral needs   during pregnancy, specifically for folic acid, iron, calcium, and vitamin D.  Remember to stay active. Ask your health care provider what types of exercise and activities are safe for you.  Practice good food safety and cleanliness. Wash your hands before you eat and after you prepare raw meat. Wash all fruits and vegetables well before peeling or eating. Taking these actions can help to prevent food-borne illnesses that can be very dangerous to your baby, such as listeriosis. Ask your health care provider for more information about listeriosis. What does 150 extra calories look like? Healthy options that provide 150 extra calories each day could be any of the  following:  6-8 oz (170-230 g) of plain low-fat yogurt with  cup of berries.  1 apple with 2 teaspoons (11 g) of peanut butter.  Cut-up vegetables with  cup (60 g) of hummus.  8 oz (230 mL) or 1 cup of low-fat chocolate milk.  1 stick of string cheese with 1 medium orange.  1 peanut butter and jelly sandwich that is made with one slice of whole-wheat bread and 1 tsp (5 g) of peanut butter. For 300 extra calories, you could eat two of those healthy options each day. What is a healthy amount of weight to gain? The right amount of weight gain for you is based on your BMI before you became pregnant. If your BMI:  Was less than 18 (underweight), you should gain 28-40 lb (13-18 kg).  Was 18-24.9 (normal), you should gain 25-35 lb (11-16 kg).  Was 25-29.9 (overweight), you should gain 15-25 lb (7-11 kg).  Was 30 or greater (obese), you should gain 11-20 lb (5-9 kg). What if I am having twins or multiples? Generally, if you are carrying twins or multiples:  You may need to eat 300-600 extra calories a day.  The recommended range for total weight gain is 25-54 lb (11-25 kg), depending on your BMI before pregnancy.  Talk with your health care provider to find out about nutritional needs, weight gain, and exercise that is right for you. What foods can I eat?  Fruits All fruits. Eat a variety of colors and types of fruit. Remember to wash your fruits well before peeling or eating. Vegetables All vegetables. Eat a variety of colors and types of vegetables. Remember to wash your vegetables well before peeling or eating. Grains All grains. Choose whole grains, such as whole-wheat bread, oatmeal, or brown rice. Meats and other protein foods Lean meats, including chicken, turkey, fish, and lean cuts of beef, veal, or pork. If you eat fish or seafood, choose options that are higher in omega-3 fatty acids and lower in mercury, such as salmon, herring, mussels, trout, sardines, pollock,  shrimp, crab, and lobster. Tofu. Tempeh. Beans. Eggs. Peanut butter and other nut butters. Make sure that all meats, poultry, and eggs are cooked to food-safe temperatures or "well-done." Two or more servings of fish are recommended each week in order to get the most benefits from omega-3 fatty acids that are found in seafood. Choose fish that are lower in mercury. You can find more information online:  www.fda.gov Dairy Pasteurized milk and milk alternatives (such as almond milk). Pasteurized yogurt and pasteurized cheese. Cottage cheese. Sour cream. Beverages Water. Juices that contain 100% fruit juice or vegetable juice. Caffeine-free teas and decaffeinated coffee. Drinks that contain caffeine are okay to drink, but it is better to avoid caffeine. Keep your total caffeine intake to less than 200 mg each day (which is 12 oz   or 355 mL of coffee, tea, or soda) or the limit as told by your health care provider. Fats and oils Fats and oils are okay to include in moderation. Sweets and desserts Sweets and desserts are okay to include in moderation. Seasoning and other foods All pasteurized condiments. The items listed above may not be a complete list of foods and beverages you can eat. Contact a dietitian for more information. What foods are not recommended? Fruits Unpasteurized fruit juices. Vegetables Raw (unpasteurized) vegetable juices. Meats and other protein foods Lunch meats, bologna, hot dogs, or other deli meats. (If you must eat those meats, reheat them until they are steaming hot.) Refrigerated pat, meat spreads from a meat counter, smoked seafood that is found in the refrigerated section of a store. Raw or undercooked meats, poultry, and eggs. Raw fish, such as sushi or sashimi. Fish that have high mercury content, such as tilefish, shark, swordfish, and king mackerel. To learn more about mercury in fish, talk with your health care provider or look for online resources, such  as:  www.fda.gov Dairy Raw (unpasteurized) milk and any foods that have raw milk in them. Soft cheeses, such as feta, queso blanco, queso fresco, Brie, Camembert cheeses, blue-veined cheeses, and Panela cheese (unless it is made with pasteurized milk, which must be stated on the label). Beverages Alcohol. Sugar-sweetened beverages, such as sodas, teas, or energy drinks. Seasoning and other foods Homemade fermented foods and drinks, such as pickles, sauerkraut, or kombucha drinks. (Store-bought pasteurized versions of these are okay.) Salads that are made in a store or deli, such as ham salad, chicken salad, egg salad, tuna salad, and seafood salad. The items listed above may not be a complete list of foods and beverages you should avoid. Contact a dietitian for more information. Where to find more information To calculate the number of calories you need based on your height, weight, and activity level, you can use an online calculator such as:  www.choosemyplate.gov/MyPlatePlan To calculate how much weight you should gain during pregnancy, you can use an online pregnancy weight gain calculator such as:  www.choosemyplate.gov/pregnancy-weight-gain-calculator Summary  While you are pregnant, your body requires additional nutrition to help support your growing baby.  Eat a variety of foods, especially fruits and vegetables to get a full range of vitamins and minerals.  Practice good food safety and cleanliness. Wash your hands before you eat and after you prepare raw meat. Wash all fruits and vegetables well before peeling or eating. Taking these actions can help to prevent food-borne illnesses, such as listeriosis, that can be very dangerous to your baby.  Do not eat raw meat or fish. Do not eat fish that have high mercury content, such as tilefish, shark, swordfish, and king mackerel. Do not eat unpasteurized (raw) dairy.  Take a prenatal vitamin to help meet your additional vitamin and  mineral needs during pregnancy, specifically for folic acid, iron, calcium, and vitamin D. This information is not intended to replace advice given to you by your health care provider. Make sure you discuss any questions you have with your health care provider. Document Revised: 03/05/2019 Document Reviewed: 07/12/2017 Elsevier Patient Education  2020 Else[employer] Inc. Prenatal Care Prenatal care is health care during pregnancy. It helps you and your unborn baby (fetus) stay as healthy as possible. Prenatal care may be provided by a midwife, a family practice health care provider, or a childbirth and pregnancy specialist (obstetrician). How does this affect me? During pregnancy, you will be closely monitored   for any new conditions that might develop. To lower your risk of pregnancy complications, you and your health care provider will talk about any underlying conditions you have. How does this affect my baby? Early and consistent prenatal care increases the chance that your baby will be healthy during pregnancy. Prenatal care lowers the risk that your baby will be:  Born early (prematurely).  Smaller than expected at birth (small for gestational age). What can I expect at the first prenatal care visit? Your first prenatal care visit will likely be the longest. You should schedule your first prenatal care visit as soon as you know that you are pregnant. Your first visit is a good time to talk about any questions or concerns you have about pregnancy. At your visit, you and your health care provider will talk about:  Your medical history, including: ? Any past pregnancies. ? Your family's medical history. ? The baby's father's medical history. ? Any long-term (chronic) health conditions you have and how you manage them. ? Any surgeries or procedures you have had. ? Any current over-the-counter or prescription medicines, herbs, or supplements you are taking.  Other factors that could pose a risk  to your baby, including:  Your home setting and your stress levels, including: ? Exposure to abuse or violence. ? Household financial strain. ? Mental health conditions you have.  Your daily health habits, including diet and exercise. Your health care provider will also:  Measure your weight, height, and blood pressure.  Do a physical exam, including a pelvic and breast exam.  Perform blood tests and urine tests to check for: ? Urinary tract infection. ? Sexually transmitted infections (STIs). ? Low iron levels in your blood (anemia). ? Blood type and certain proteins on red blood cells (Rh antibodies). ? Infections and immunity to viruses, such as hepatitis B and rubella. ? HIV (human immunodeficiency virus).  Do an ultrasound to confirm your baby's growth and development and to help predict your estimated due date (EDD). This ultrasound is done with a probe that is inserted into the vagina (transvaginal ultrasound).  Discuss your options for genetic screening.  Give you information about how to keep yourself and your baby healthy, including: ? Nutrition and taking vitamins. ? Physical activity. ? How to manage pregnancy symptoms such as nausea and vomiting (morning sickness). ? Infections and substances that may be harmful to your baby and how to avoid them. ? Food safety. ? Dental care. ? Working. ? Travel. ? Warning signs to watch for and when to call your health care provider. How often will I have prenatal care visits? After your first prenatal care visit, you will have regular visits throughout your pregnancy. The visit schedule is often as follows:  Up to week 28 of pregnancy: once every 4 weeks.  28-36 weeks: once every 2 weeks.  After 36 weeks: every week until delivery. Some women may have visits more or less often depending on any underlying health conditions and the health of the baby. Keep all follow-up and prenatal care visits as told by your health care  provider. This is important. What happens during routine prenatal care visits? Your health care provider will:  Measure your weight and blood pressure.  Check for fetal heart sounds.  Measure the height of your uterus in your abdomen (fundal height). This may be measured starting around week 20 of pregnancy.  Check the position of your baby inside your uterus.  Ask questions about your diet, sleeping patterns, and   whether you can feel the baby move.  Review warning signs to watch for and signs of labor.  Ask about any pregnancy symptoms you are having and how you are dealing with them. Symptoms may include: ? Headaches. ? Nausea and vomiting. ? Vaginal discharge. ? Swelling. ? Fatigue. ? Constipation. ? Any discomfort, including back or pelvic pain. Make a list of questions to ask your health care provider at your routine visits. What tests might I have during prenatal care visits? You may have blood, urine, and imaging tests throughout your pregnancy, such as:  Urine tests to check for glucose, protein, or signs of infection.  Glucose tests to check for a form of diabetes that can develop during pregnancy (gestational diabetes mellitus). This is usually done around week 24 of pregnancy.  An ultrasound to check your baby's growth and development and to check for birth defects. This is usually done around week 20 of pregnancy.  A test to check for group B strep (GBS) infection. This is usually done around week 36 of pregnancy.  Genetic testing. This may include blood or imaging tests, such as an ultrasound. Some genetic tests are done during the first trimester and some are done during the second trimester. What else can I expect during prenatal care visits? Your health care provider may recommend getting certain vaccines during pregnancy. These may include:  A yearly flu shot (annual influenza vaccine). This is especially important if you will be pregnant during flu  season.  Tdap (tetanus, diphtheria, pertussis) vaccine. Getting this vaccine during pregnancy can protect your baby from whooping cough (pertussis) after birth. This vaccine may be recommended between weeks 27 and 36 of pregnancy. Later in your pregnancy, your health care provider may give you information about:  Childbirth and breastfeeding classes.  Choosing a health care provider for your baby.  Umbilical cord banking.  Breastfeeding.  Birth control after your baby is born.  The hospital labor and delivery unit and how to tour it.  Registering at the hospital before you go into labor. Where to find more information  Office on Women's Health: womenshealth.gov  American Pregnancy Association: americanpregnancy.org  March of Dimes: marchofdimes.org Summary  Prenatal care helps you and your baby stay as healthy as possible during pregnancy.  Your first prenatal care visit will most likely be the longest.  You will have visits and tests throughout your pregnancy to monitor your health and your baby's health.  Bring a list of questions to your visits to ask your health care provider.  Make sure to keep all follow-up and prenatal care visits with your health care provider. This information is not intended to replace advice given to you by your health care provider. Make sure you discuss any questions you have with your health care provider. Document Revised: 02/04/2019 Document Reviewed: 10/14/2017 Elsevier Patient Education  2020 Elsevier Inc.     COVID-19 and Your Pregnancy FAQ  How can I prevent infection with COVID-19 during my pregnancy? Social distancing is key. Please limit any interactions in public. Try and work from home if possible. Frequently wash your hands after touching possibly contaminated surfaces. Avoid touching your face.  Minimize trips to the store. Consider online ordering when possible.   Should I wear a mask? YES. It is recommended by the CDC  that all people wear a cloth mask or facial covering in public. You should wear a mask to your visits in the office. This will help reduce transmission as well as your   risk or acquiring COVID-19. New studies are showing that even asymptomatic individuals can spread the virus from talking.   Where can I get a mask? [place] and the city of [place] are partnering to provide masks to community members. You can pick up a mask from several locations. This website also has instructions about how to make a mask by sewing or without sewing by using a t-shirt or bandana.  https://www.[place].gov/i-want-to/learn-about/covid-19-information-and-updates/covid-19-face-mask-project  Studies have shown that if you were a tube or nylon stocking from pantyhose over a cloth mask it makes the cloth mask almost as effective as a N95 mask.  https://www.npr.org/sections/goatsandsoda/2019/02/18/840146830/adding-a-nylon-stocking-layer-could-boost-protection-from-cloth-masks-study-find  What are the symptoms of COVID-19? Fever (greater than 100.4 F), dry cough, shortness of breath.  Am I more at risk for COVID-19 since I am pregnant? There is not currently data showing that pregnant women are more adversely impacted by COVID-19 than the general population. However, we know that pregnant women tend to have worse respiratory complications from similar diseases such as the flu and SARS and for this reason should be considered an at-risk population.  What do I do if I am experiencing the symptoms of COVID-19? Testing is being limited because of test availability. If you are experiencing symptoms you should quarantine yourself, and the members of your family, for at least 2 weeks at home.   Please visit this website for more information: https://www.cdc.gov/coronavirus/2019-ncov/if-you-are-sick/steps-when-sick.html  When should I go to the Emergency Room? Please go to the emergency room if you are experiencing  ANY of these symptoms*:  1.    Difficulty breathing or shortness of breath 2.    Persistent pain or pressure in the chest 3.    Confusion or difficulty being aroused (or awakened) 4.    Bluish lips or face  *This list is not all inclusive. Please consult our office for any other symptoms that are severe or concerning.  What do I do if I am having difficulty breathing? You should go to the Emergency Room for evaluation. At this time they have a tent set up for evaluating patients with COVID-19 symptoms.   How will my prenatal care be different because of the COVID-19 pandemic? It has been recommended to reduce the frequency of face-to-face visits and use resources such as telephone and virtual visits when possible. Using a scale, blood pressure machine and fetal doppler at home can further help reduce face-to-face visits. You will be provided with additional information on this topic.  We ask that you come to your visits alone to minimize potential exposures to  COVID-19.  How can I receive childbirth education? At this time in-person classes have been cancelled. You can register for online childbirth education, breastfeeding, and newborn care classes.  Please visit:  [email or web address] for more information  How will my hospital birth experience be different? The hospital is currently limiting visitors. This means that while you are in labor you can only have one person at the hospital with you. Additional family members will not be allowed to wait in the building or outside your room. Your one support person can be the father of the baby, a relative, a doula, or a friend. Once one support person is designated that person will wear a band. This band cannot be shared with multiple people.  Nitrous Gas is not being offered for pain relief since the tubing and filter for the machine can not be sanitized in a way to guarantee prevention of transmission of COVID-19.  Nasal cannula use of    oxygen for fetal indications has also been discontinued.  Currently a clear plastic sheet is being hung between mom and the delivering provider during pushing and delivery to help prevent transmission of COVID-19.      How long will I stay in the hospital for after giving birth? It is also recommended that discharge home be expedited during the COVID-19 outbreak. This means staying for 1 day after a vaginal delivery and 2 days after a cesarean section. Patients who need to stay longer for medical reasons are allowed to do so, but the goal will be for expedited discharge home.   What if I have COVID-19 and I am in labor? We ask that you wear a mask while on labor and delivery. We will try and accommodate you being placed in a room that is capable of filtering the air. Please call ahead if you are in labor and on your way to the hospital. The phone number for labor and delivery at [facility] is [phone].  If I have COVID-19 when my baby is born how can I prevent my baby from contracting COVID-19? This is an issue that will have to be discussed on a case-by-case basis. Current recommendations suggest providing separate isolation rooms for both the mother and new infant as well as limiting visitors. However, there are practical challenges to this recommendation. The situation will assuredly change and decisions will be influenced by the desires of the mother and availability of space.  Some suggestions are the use of a curtain or physical barrier between mom and infant, hand hygiene, mom wearing a mask, or 6 feet of spacing between a mom and infant.   Can I breastfeed during the COVID-19 pandemic?   Yes, breastfeeding is encouraged.  Can I breastfeed if I have COVID-19? Yes. Covid-19 has not been found in breast milk. This means you cannot give COVID-19 to your child through breast milk. Breast feeding will also help pass antibodies to fight infection to your baby.   What  precautions should I take when breastfeeding if I have COVID-19? If a mother and newborn do room-in and the mother wishes to feed at the breast, she should put on a facemask and practice hand hygiene before each feeding.  What precautions should I take when pumping if I have COVID-19? Prior to expressing breast milk, mothers should practice hand hygiene. After each pumping session, all parts that come into contact with breast milk should be thoroughly washed and the entire pump should be appropriately disinfected per the manufacturer's instructions. This expressed breast milk should be fed to the newborn by a healthy caregiver.  What if I am pregnant and work in healthcare? Based on limited data regarding COVID-19 and pregnancy, ACOG currently does not propose creating additional restrictions on pregnant health care personnel because of COVID-19 alone. Pregnant women do not appear to be at higher risk of severe disease related to COVID-19. Pregnant health care personnel should follow CDC risk assessment and infection control guidelines for health care personnel exposed to patients with suspected or confirmed COVID-19. Adherence to recommended infection prevention and control practices is an important part of protecting all health care personnel in health care settings.    Information on COVID-19 in pregnancy is very limited; however, facilities may want to consider limiting exposure of pregnant health care personnel to patients with confirmed or suspected COVID-19 infection, especially during higher-risk procedures (eg, aerosol-generating procedures), if feasible, based on staffing availability.    

## 2020-02-02 NOTE — Progress Notes (Signed)
No vb. No lof. Dating scan today 

## 2020-02-02 NOTE — Progress Notes (Signed)
  Routine Prenatal Care Visit  Subjective  Rebekah Dennis is a 24 y.o. G1P0 at [redacted]w[redacted]d being seen today for ongoing prenatal care.  She is currently monitored for the following issues for this low-risk pregnancy and has Chronic dermatitis and Supervision of other normal pregnancy, antepartum on their problem list.  ----------------------------------------------------------------------------------- Patient reports no complaints.  We discussed dating scan results. Anatomy scan at next visit.  . Vag. Bleeding: None.  Movement: Present. Leaking Fluid denies.  ----------------------------------------------------------------------------------- The following portions of the patient's history were reviewed and updated as appropriate: allergies, current medications, past family history, past medical history, past social history, past surgical history and problem list. Problem list updated.  Objective  Blood pressure 122/74, weight 152 lb (68.9 kg), last menstrual period 08/29/2019. Pregravid weight 145 lb (65.8 kg) Total Weight Gain 7 lb (3.175 kg) Urinalysis: Urine Protein    Urine Glucose    Fetal Status: Fetal Heart Rate (bpm): 160   Movement: Present      Dating: By CRL [redacted]w[redacted]d, By BPD/AC/FL [redacted]w[redacted]d, EDD adjusted to 14w GA with EDD of 08/01/20  General:  Alert, oriented and cooperative. Patient is in no acute distress.  Skin: Skin is warm and dry. No rash noted.   Cardiovascular: Normal heart rate noted  Respiratory: Normal respiratory effort, no problems with respiration noted  Abdomen: Soft, gravid, appropriate for gestational age. Pain/Pressure: Absent     Pelvic:  Cervical exam deferred        Extremities: Normal range of motion.  Edema: None  Mental Status: Normal mood and affect. Normal behavior. Normal judgment and thought content.   Assessment   24 y.o. G1P0 at [redacted]w[redacted]d by  08/01/2020, by Ultrasound presenting for routine prenatal visit  Plan   pregnancy Problems (from 01/20/20 to present)     Problem Noted Resolved   Supervision of other normal pregnancy, antepartum 02/02/2020 by Tresea Mall, CNM No   Overview Signed 02/02/2020  3:08 PM by Tresea Mall, CNM    Clinic [facility] Prenatal Labs  Dating EDD by 14w u/s Blood type: A/Positive/-- (03/24 1544)   Genetic Screen 1 Screen:    AFP:     Quad:     NIPS: Antibody:Negative (03/24 1544)  Anatomic [place]  Rubella: 1.49 (03/24 1544) Varicella: @VZVIGG @  GTT Third trimester:  RPR: Non Reactive (03/24 1544)   Rhogam  HBsAg: Negative (03/24 1544)   TDaP vaccine                       Flu Shot: HIV: Non Reactive (03/24 1544)   Baby Food                                GBS:   Contraception  Pap:  CBB     CS/VBAC    Support Person            MaterniT 21 and Inheritest today   Preterm labor symptoms and general obstetric precautions including but not limited to vaginal bleeding, contractions, leaking of fluid and fetal movement were reviewed in detail with the patient. Please refer to After Visit Summary for other counseling recommendations.   Return in about 4 weeks (around 03/01/2020) for anatomy and rob.  05/01/2020, CNM 02/02/2020 3:20 PM

## 2020-02-06 LAB — MATERNIT 21 PLUS CORE, BLOOD
Fetal Fraction: 10
Result (T21): NEGATIVE
Trisomy 13 (Patau syndrome): NEGATIVE
Trisomy 18 (Edwards syndrome): NEGATIVE
Trisomy 21 (Down syndrome): NEGATIVE

## 2020-02-09 ENCOUNTER — Emergency Department
Admission: EM | Admit: 2020-02-09 | Discharge: 2020-02-09 | Disposition: A | Discharge status: Home / Self Care | Payer: Medicaid Other | Attending: Student | Admitting: Student

## 2020-02-09 ENCOUNTER — Other Ambulatory Visit: Payer: Self-pay

## 2020-02-09 DIAGNOSIS — Z87891 Personal history of nicotine dependence: Secondary | ICD-10-CM | POA: Diagnosis not present

## 2020-02-09 DIAGNOSIS — O99891 Other specified diseases and conditions complicating pregnancy: Secondary | ICD-10-CM | POA: Diagnosis present

## 2020-02-09 DIAGNOSIS — Z3A15 15 weeks gestation of pregnancy: Secondary | ICD-10-CM | POA: Insufficient documentation

## 2020-02-09 DIAGNOSIS — R05 Cough: Secondary | ICD-10-CM

## 2020-02-09 DIAGNOSIS — Z91040 Latex allergy status: Secondary | ICD-10-CM | POA: Insufficient documentation

## 2020-02-09 DIAGNOSIS — R1030 Lower abdominal pain, unspecified: Secondary | ICD-10-CM | POA: Diagnosis not present

## 2020-02-09 DIAGNOSIS — O98512 Other viral diseases complicating pregnancy, second trimester: Secondary | ICD-10-CM | POA: Insufficient documentation

## 2020-02-09 DIAGNOSIS — U071 COVID-19: Secondary | ICD-10-CM | POA: Diagnosis not present

## 2020-02-09 DIAGNOSIS — R059 Cough, unspecified: Secondary | ICD-10-CM

## 2020-02-09 LAB — URINALYSIS, COMPLETE (UACMP) WITH MICROSCOPIC
Bacteria, UA: NONE SEEN
Bilirubin Urine: NEGATIVE
Glucose, UA: NEGATIVE mg/dL
Hgb urine dipstick: NEGATIVE
Ketones, ur: NEGATIVE mg/dL
Nitrite: NEGATIVE
Protein, ur: NEGATIVE mg/dL
Specific Gravity, Urine: 1.02 (ref 1.005–1.030)
pH: 7 (ref 5.0–8.0)

## 2020-02-09 LAB — COMPREHENSIVE METABOLIC PANEL
ALT: 32 U/L (ref 0–44)
AST: 26 U/L (ref 15–41)
Albumin: 3.7 g/dL (ref 3.5–5.0)
Alkaline Phosphatase: 33 U/L — ABNORMAL LOW (ref 38–126)
Anion gap: 6 (ref 5–15)
BUN: 8 mg/dL (ref 6–20)
CO2: 24 mmol/L (ref 22–32)
Calcium: 8.9 mg/dL (ref 8.9–10.3)
Chloride: 104 mmol/L (ref 98–111)
Creatinine, Ser: 0.61 mg/dL (ref 0.44–1.00)
GFR calc Af Amer: >60 mL/min (ref >60)
GFR calc non Af Amer: >60 mL/min (ref >60)
Glucose, Bld: 76 mg/dL (ref 70–99)
Potassium: 3.5 mmol/L (ref 3.5–5.1)
Sodium: 134 mmol/L — ABNORMAL LOW (ref 135–145)
Total Bilirubin: 0.5 mg/dL (ref 0.3–1.2)
Total Protein: 6.8 g/dL (ref 6.5–8.1)

## 2020-02-09 LAB — SARS CORONAVIRUS 2 (TAT 6-24 HRS): SARS Coronavirus 2: POSITIVE — AB

## 2020-02-09 LAB — POCT PREGNANCY, URINE: Preg Test, Ur: POSITIVE — AB

## 2020-02-09 LAB — CBC
HCT: 34.9 % — ABNORMAL LOW (ref 36.0–46.0)
Hemoglobin: 12.3 g/dL (ref 12.0–15.0)
MCH: 31.6 pg (ref 26.0–34.0)
MCHC: 35.2 g/dL (ref 30.0–36.0)
MCV: 89.7 fL (ref 80.0–100.0)
Platelets: 185 10*3/uL (ref 150–400)
RBC: 3.89 MIL/uL (ref 3.87–5.11)
RDW: 12.1 % (ref 11.5–15.5)
WBC: 5.7 10*3/uL (ref 4.0–10.5)
nRBC: 0 % (ref 0.0–0.2)

## 2020-02-09 LAB — HCG, QUANTITATIVE, PREGNANCY: hCG, Beta Chain, Quant, S: 129630 m[IU]/mL — ABNORMAL HIGH (ref <5)

## 2020-02-09 LAB — LIPASE, BLOOD: Lipase: 23 U/L (ref 11–51)

## 2020-02-09 MED ORDER — SODIUM CHLORIDE 0.9% FLUSH: 3.0000 mL | Freq: Once | INTRAVENOUS | Status: DC

## 2020-02-09 MED ORDER — SUCRALFATE 1 G PO TABS: 1.0000 g | ORAL_TABLET | Freq: Three times a day (TID) | ORAL | 0 refills | Status: DC | PRN

## 2020-02-09 NOTE — ED Triage Notes (Signed)
Pt states she is about [redacted] weeks pregnant and was up coughing a lot last night, states she is now having lower abd pain , denies any bleeding or discharge

## 2020-02-09 NOTE — ED Provider Notes (Signed)
[facility] Emergency Department Provider Note  ____________________________________________   First MD Initiated Contact with Patient 02/09/20 1008     (approximate)  I have reviewed the triage vital signs and the nursing notes.  History  Chief Complaint Abdominal Pain    HPI Rebekah Dennis is a 24 y.o. female G1P0 at [redacted]w[redacted]d (confirmed IUP on US done by OB on 02/02/20) who presents for cough and lower abdominal pain.  Patient states last night she developed a dry cough and scratchy throat.  After coughing spell she felt like she had developed some lower abdominal pain.  Describes it as a sharp, shooting type pain in the bilateral lower abdomen area.  Intermittent.  Moderate in severity.  Comes and goes, seems to be triggered by coughing spells.  Cough is non-productive.  No vomiting or diarrhea.  No fevers or chest pain. Needed to blow her nose, but denies any rhinorrhea. Denies any vaginal bleeding, vaginal discharge, leakage of fluid, or abdominal cramping.  No dysuria. Has not taken anything for her symptoms. Does report some reflux/indigestion during this pregnancy, hasn't taken anything for this either.  States she presented largely out of an abundance of caution "to get checked out" as this is her first pregnancy. No known sick contacts or COVID exposure. Has had her 1st Pfizer vaccine, pending the 2nd.    Past Medical Hx Past Medical History:  Diagnosis Date  . Chronic dermatitis   . History of UTI     Problem List Patient Active Problem List   Diagnosis Date Noted  . Supervision of other normal pregnancy, antepartum 02/02/2020  . Chronic dermatitis 08/18/2015    Past Surgical Hx Past Surgical History:  Procedure Laterality Date  . Denies surgical history      Medications Prior to Admission medications   Medication Sig Start Date End Date Taking? Authorizing Provider  Prenatal Vit-Fe Phos-FA-Omega (VITAFOL GUMMIES) 3.33-0.333-34.8 MG CHEW Chew  1 tablet by mouth daily. 01/25/20   Vena Austria, MD    Allergies Latex  Family Hx Family History  Problem Relation Age of Onset  . Heart attack Maternal Grandfather   . Heart attack Paternal Grandmother   . Stroke Paternal Grandmother     Social Hx Social History   Tobacco Use  . Smoking status: Former Smoker    Packs/day: 0.25    Years: 9.00    Pack years: 2.25    Types: Cigarettes    Quit date: 01/02/2020    Years since quitting: 0.1  . Smokeless tobacco: Never Used  Substance Use Topics  . Alcohol use: Not Currently    Alcohol/week: 0.0 standard drinks    Comment: Last ETOH use 01/02/2020  . Drug use: Yes    Types: Marijuana    Comment: Last week use 01/01/2020     Review of Systems  Constitutional: Negative for fever. Negative for chills. Eyes: Negative for visual changes. ENT: Negative for sore throat. Cardiovascular: Negative for chest pain. Respiratory: Negative for shortness of breath.  Positive for cough. Gastrointestinal: Negative for nausea. Negative for vomiting.  Positive for lower abdominal pain.   Genitourinary: Negative for dysuria. Musculoskeletal: Negative for leg swelling. Skin: Negative for rash. Neurological: Negative for headaches.   Physical Exam  Vital Signs: ED Triage Vitals [02/09/20 0848]  Enc Vitals Group     BP 98/81     Pulse Rate 100     Resp 16     Temp 99 F (37.2 C)  Temp Source Oral     SpO2 100 %     Weight 150 lb (68 kg)     Height 5\' 9"  (1.753 m)     Head Circumference      Peak Flow      Pain Score 7     Pain Loc      Pain Edu?      Excl. in [place]?     Constitutional: Alert and oriented. Well appearing. NAD.  Head: Normocephalic. Atraumatic. Eyes: Conjunctivae clear. Sclera anicteric. Pupils equal and symmetric. Nose: No masses or lesions. No congestion or rhinorrhea. Mouth/Throat: Wearing mask.  Oropharynx is nonerythematous. Perhaps mild cobblestoning of posterior mucosa.  Neck: No stridor. Trachea  midline.  Cardiovascular: Normal rate, regular rhythm. Extremities well perfused. Respiratory: Normal respiratory effort.  Lungs CTAB.  No wheezing. Gastrointestinal: Soft. Non-distended. Non-tender throughout to deep palpation.  Gravid. BSUS reveals IUP with FHR 150s. Doppler FHTs 150s as well. Genitourinary: Deferred. Musculoskeletal: No lower extremity edema. No deformities. Neurologic:  Normal speech and language. No gross focal or lateralizing neurologic deficits are appreciated.  Skin: Skin is warm, dry and intact. No rash noted. Psychiatric: Mood and affect are appropriate for situation.  EKG  N/A   Radiology  Personally reviewed available imaging myself.   BSUS, as below. IUP identified with FHTs 150s.    Procedures  Procedure(s) performed (including critical care):  Ultrasound ED OB Pelvic  Date/Time: 02/09/2020 10:46 AM Performed by: Lilia Pro., MD Authorized by: Lilia Pro., MD   Procedure details:    Indications: pregnant with abdominal pain     Assess:  Intrauterine pregnancy (FHT)   Technique:  Transabdominal obstetric (HCG+) exam   Images: not archived    Uterine findings:    Intrauterine pregnancy: identified     Fetal heart rate: identified (150s)      Comments:     IUP identified, fetal heart rate obtained with M-mode, FHTs appropriate in the 150s.    Initial Impression / Assessment and Plan / MDM / ED Course  24 y.o. female who presents to the ED for cough, intermittent lower abdominal pain. G1P0 at [redacted]w[redacted]d.   Ddx: MSK pain, round ligament pain, post nasal drip, COVID (has had vaccine 1 of 2), GERD related cough. No bacteria or evidence of infection on UA. No signs/symptoms of pelvic infection based on history.   Labs initiated in triage without actionable derangements.  Oropharyngeal and lung exam reassuring. Discussed risk/benefits of CXR with patient in the setting of her pregnancy.  Given her reassuring exam and vitals at this time,  patient would like to defer, feel this is reasonable.  She is agreeable with a COVID swab.  Otherwise given negative work-up, reassuring fetal heart rate by [place] and doppler, patient is stable for discharge with outpatient follow-up.  Provided Rx for sucralfate for indigestion/possible cough related to this.  Given return precautions.  Patient voices understanding and is comfortable to plan and discharge.   _______________________________   As part of my medical decision making I have reviewed available labs, radiology tests, reviewed old records/chart review.    Final Clinical Impression(s) / ED Diagnosis  Final diagnoses:  Cough  Lower abdominal pain  [redacted] weeks gestation of pregnancy       Note:  This document was prepared using Dragon voice recognition software and may include unintentional dictation errors.   Lilia Pro., MD 02/09/20 1054

## 2020-02-09 NOTE — Discharge Instructions (Signed)
Thank you for letting us take care of you in the emergency department today.   Please continue to take any regular, prescribed medications, including your prenatal vitamins.  For aches and pains, you may take Tylenol as directed on the box.  New medications we have prescribed:  Sucralfate, take as needed for reflux/indigestion/heartburn  Please follow up with: Your OB/GYN doctor to review your ER visit and follow up on your symptoms.   Please return to the ER for any new or worsening symptoms.

## 2020-02-10 LAB — INHERITEST CORE(CF97,SMA,FRAX)

## 2020-02-16 ENCOUNTER — Ambulatory Visit: Payer: Medicaid Other

## 2020-03-01 ENCOUNTER — Other Ambulatory Visit: Payer: Self-pay

## 2020-03-01 ENCOUNTER — Other Ambulatory Visit: Payer: Self-pay | Admitting: Certified Nurse Midwife

## 2020-03-01 ENCOUNTER — Ambulatory Visit (INDEPENDENT_AMBULATORY_CARE_PROVIDER_SITE_OTHER): Payer: Medicaid Other | Admitting: Certified Nurse Midwife

## 2020-03-01 ENCOUNTER — Ambulatory Visit (INDEPENDENT_AMBULATORY_CARE_PROVIDER_SITE_OTHER): Payer: Medicaid Other

## 2020-03-01 VITALS — BP 122/78 | Wt 161.0 lb

## 2020-03-01 DIAGNOSIS — Z3480 Encounter for supervision of other normal pregnancy, unspecified trimester: Secondary | ICD-10-CM

## 2020-03-01 DIAGNOSIS — O4442 Low lying placenta NOS or without hemorrhage, second trimester: Secondary | ICD-10-CM

## 2020-03-01 DIAGNOSIS — U071 COVID-19: Secondary | ICD-10-CM | POA: Insufficient documentation

## 2020-03-01 DIAGNOSIS — Z3686 Encounter for antenatal screening for cervical length: Secondary | ICD-10-CM

## 2020-03-01 DIAGNOSIS — Z3A18 18 weeks gestation of pregnancy: Secondary | ICD-10-CM

## 2020-03-01 LAB — POCT URINALYSIS DIPSTICK OB
Glucose, UA: NEGATIVE
POC,PROTEIN,UA: NEGATIVE

## 2020-03-01 NOTE — Progress Notes (Signed)
No vb. No lof. Anatomy scan today. 

## 2020-03-01 NOTE — Progress Notes (Signed)
ROB at 18wk1day: Now recovered from Covid 19 infection (symptoms head cogestion, headache, loss of small and taste now resolved). Denies any fetal movement. Denies vaginal bleeding. MaterniT 21 test was diploid XY, Inheritest was negative. Thinking about breast feeding  Anatomy scan today: CGA 18 weeks, normal anatomy, female gender posterior low lying placenta (0.7cm from cx os) Discussed results of ultrasound. Explained that placenta using "migrates" away from the cervix. Will repeat ultrasound at 28 weeks  A: IUP at 18wk1d S=D Resolved Covid 19 infection  P: ROB in 4 weeks Discussed Ready Set Baby web site The following were addressed during this visit:  Breastfeeding Education - The importance of early skin-to-skin contact    Comments: Keeps baby warm and secure, helps keep baby's blood sugar up and breathing steady, easier to bond and breastfeed, and helps calm baby.  - Rooming-in on a 24-hour basis    Comments: Easier to learn baby's feeding cues, easier to bond and get to know each other, and encourages milk production.   - Feeding on demand or baby-led feeding    Comments: Helps prevent breastfeeding complications, helps bring in good milk supply, prevents under or overfeeding, and helps baby feel content and satisfied   - Frequent feeding to help assure optimal milk production    Comments: Making a full supply of milk requires frequent removal of milk from breasts, infant will eat 8-12 times in 24 hours, if separated from infant use breast massage, hand expression and/ or pumping to remove milk from breasts.  Farrel Conners, CNM

## 2020-03-29 ENCOUNTER — Other Ambulatory Visit: Payer: Self-pay

## 2020-03-29 ENCOUNTER — Ambulatory Visit (INDEPENDENT_AMBULATORY_CARE_PROVIDER_SITE_OTHER): Payer: Medicaid Other | Admitting: Advanced Practice Midwife

## 2020-03-29 ENCOUNTER — Encounter: Payer: Self-pay | Admitting: Advanced Practice Midwife

## 2020-03-29 VITALS — BP 126/84 | Wt 169.0 lb

## 2020-03-29 DIAGNOSIS — Z3482 Encounter for supervision of other normal pregnancy, second trimester: Secondary | ICD-10-CM

## 2020-03-29 DIAGNOSIS — Z130 Encounter for screening for diseases of the blood and blood-forming organs and certain disorders involving the immune mechanism: Secondary | ICD-10-CM

## 2020-03-29 DIAGNOSIS — Z3A22 22 weeks gestation of pregnancy: Secondary | ICD-10-CM

## 2020-03-29 DIAGNOSIS — Z131 Encounter for screening for diabetes mellitus: Secondary | ICD-10-CM

## 2020-03-29 DIAGNOSIS — Z113 Encounter for screening for infections with a predominantly sexual mode of transmission: Secondary | ICD-10-CM

## 2020-03-29 NOTE — Progress Notes (Signed)
No vb. No lof.  

## 2020-03-29 NOTE — Progress Notes (Signed)
  Routine Prenatal Care Visit  Subjective  Rebekah Dennis is a 24 y.o. G1P0 at [redacted]w[redacted]d being seen today for ongoing prenatal care.  She is currently monitored for the following issues for this low-risk pregnancy and has Chronic dermatitis; Supervision of other normal pregnancy, antepartum; Low-lying placenta in second trimester; and COVID-19 virus infection on their problem list.  ----------------------------------------------------------------------------------- Patient reports backache.  We discussed comfort measures. She has gotten a heating pad and pregnancy pillow.  . Vag. Bleeding: None.  Movement: Present. Leaking Fluid denies.  ----------------------------------------------------------------------------------- The following portions of the patient's history were reviewed and updated as appropriate: allergies, current medications, past family history, past medical history, past social history, past surgical history and problem list. Problem list updated.  Objective  Blood pressure 126/84, weight 169 lb (76.7 kg), last menstrual period 08/29/2019. Pregravid weight 145 lb (65.8 kg) Total Weight Gain 24 lb (10.9 kg) Urinalysis: Urine Protein    Urine Glucose    Fetal Status: Fetal Heart Rate (bpm): 156 Fundal Height: 22 cm Movement: Present     General:  Alert, oriented and cooperative. Patient is in no acute distress.  Skin: Skin is warm and dry. No rash noted.   Cardiovascular: Normal heart rate noted  Respiratory: Normal respiratory effort, no problems with respiration noted  Abdomen: Soft, gravid, appropriate for gestational age. Pain/Pressure: Absent     Pelvic:  Cervical exam deferred        Extremities: Normal range of motion.  Edema: None  Mental Status: Normal mood and affect. Normal behavior. Normal judgment and thought content.   Assessment   24 y.o. G1P0 at [redacted]w[redacted]d by  08/01/2020, by Ultrasound presenting for routine prenatal visit  Plan   pregnancy Problems (from 01/20/20  to present)    Problem Noted Resolved   Supervision of other normal pregnancy, antepartum 02/02/2020 by Tresea Mall, CNM No   Overview Addendum 03/01/2020  4:51 PM by Farrel Conners, CNM    Clinic [facility] Prenatal Labs  Dating EDD by 14w u/s Blood type: A/Positive/-- (03/24 1544)   Genetic Screen Inheritest negative     NIPS: diploid XY Antibody:Negative (03/24 1544)  Anatomic [place] Low lying placenta, otherwise normal anatomy, female gender Rubella: 1.49 (03/24 1544) Varicella: Immune  GTT Third trimester:  RPR: Non Reactive (03/24 1544)   Rhogam  HBsAg: Negative (03/24 1544)   TDaP vaccine                       Flu Shot: HIV: Non Reactive (03/24 1544)   Baby Food                                GBS:   Contraception  Pap: NIL/ +HRHPV  CBB     CS/VBAC    Support Person            Back pain: abdominal support band, stretches, exercises, heat, ice, epsom salt soaks   Preterm labor symptoms and general obstetric precautions including but not limited to vaginal bleeding, contractions, leaking of fluid and fetal movement were reviewed in detail with the patient.   Return in about 4 weeks (around 04/26/2020) for 28 wk labs/placentation u/s and rob.  Tresea Mall, CNM 03/29/2020 3:35 PM

## 2020-04-01 ENCOUNTER — Ambulatory Visit: Payer: Medicaid Other | Attending: Internal Medicine

## 2020-04-01 DIAGNOSIS — Z23 Encounter for immunization: Secondary | ICD-10-CM

## 2020-04-01 NOTE — Progress Notes (Signed)
   Covid-19 Vaccination Clinic  Name:  Rebekah Dennis    MRN: [id number] DOB: Oct 21, 1996  04/01/2020  Rebekah Dennis was observed post Covid-19 immunization for 15 minutes without incident. She was provided with Vaccine Information Sheet and instruction to access the V-Safe system.   Rebekah Dennis was instructed to call 911 with any severe reactions post vaccine: Marland Kitchen Difficulty breathing  . Swelling of face and throat  . A fast heartbeat  . A bad rash all over body  . Dizziness and weakness   Immunizations Administered    Name Date Dose VIS Date Route   Pfizer COVID-19 Vaccine 04/01/2020  9:47 AM 0.3 mL 12/23/2018 Intramuscular   Manufacturer: [employer], [employer]   Lot: [id number]   NDC: [address]-8

## 2020-04-25 ENCOUNTER — Ambulatory Visit: Payer: Medicaid Other

## 2020-04-26 ENCOUNTER — Ambulatory Visit (INDEPENDENT_AMBULATORY_CARE_PROVIDER_SITE_OTHER): Payer: Medicaid Other | Admitting: Obstetrics and Gynecology

## 2020-04-26 ENCOUNTER — Ambulatory Visit (INDEPENDENT_AMBULATORY_CARE_PROVIDER_SITE_OTHER): Payer: Medicaid Other

## 2020-04-26 ENCOUNTER — Other Ambulatory Visit: Payer: Medicaid Other

## 2020-04-26 ENCOUNTER — Other Ambulatory Visit: Payer: Self-pay

## 2020-04-26 ENCOUNTER — Encounter: Payer: Self-pay | Admitting: Obstetrics and Gynecology

## 2020-04-26 VITALS — BP 114/68 | Ht 69.0 in | Wt 173.0 lb

## 2020-04-26 DIAGNOSIS — Z113 Encounter for screening for infections with a predominantly sexual mode of transmission: Secondary | ICD-10-CM

## 2020-04-26 DIAGNOSIS — Z23 Encounter for immunization: Secondary | ICD-10-CM

## 2020-04-26 DIAGNOSIS — Z3480 Encounter for supervision of other normal pregnancy, unspecified trimester: Secondary | ICD-10-CM

## 2020-04-26 DIAGNOSIS — O4442 Low lying placenta NOS or without hemorrhage, second trimester: Secondary | ICD-10-CM

## 2020-04-26 DIAGNOSIS — Z3482 Encounter for supervision of other normal pregnancy, second trimester: Secondary | ICD-10-CM

## 2020-04-26 DIAGNOSIS — Z130 Encounter for screening for diseases of the blood and blood-forming organs and certain disorders involving the immune mechanism: Secondary | ICD-10-CM

## 2020-04-26 DIAGNOSIS — Z131 Encounter for screening for diabetes mellitus: Secondary | ICD-10-CM

## 2020-04-26 LAB — POCT URINALYSIS DIPSTICK OB
Glucose, UA: NEGATIVE
POC,PROTEIN,UA: NEGATIVE

## 2020-04-26 NOTE — Progress Notes (Signed)
    Routine Prenatal Care Visit  Subjective  Rebekah Dennis is a 24 y.o. G1P0 at [redacted]w[redacted]d being seen today for ongoing prenatal care.  She is currently monitored for the following issues for this low-risk pregnancy and has Chronic dermatitis; Supervision of other normal pregnancy, antepartum; Low-lying placenta in second trimester; and COVID-19 virus infection on their problem list.  ----------------------------------------------------------------------------------- Patient reports swelling in feet. Some difficulty standing for long periods of time.   Contractions: Not present. Vag. Bleeding: None.  Movement: Present. Denies leaking of fluid.  ----------------------------------------------------------------------------------- The following portions of the patient's history were reviewed and updated as appropriate: allergies, current medications, past family history, past medical history, past social history, past surgical history and problem list. Problem list updated.   Objective  Blood pressure 114/68, height 5\' 9"  (1.753 m), weight 173 lb (78.5 kg), last menstrual period 08/29/2019. Pregravid weight 145 lb (65.8 kg) Total Weight Gain 28 lb (12.7 kg) Urinalysis:      Fetal Status: Fetal Heart Rate (bpm): 155 Fundal Height: 26 cm Movement: Present     General:  Alert, oriented and cooperative. Patient is in no acute distress.  Skin: Skin is warm and dry. No rash noted.   Cardiovascular: Normal heart rate noted  Respiratory: Normal respiratory effort, no problems with respiration noted  Abdomen: Soft, gravid, appropriate for gestational age. Pain/Pressure: Absent     Pelvic:  Cervical exam deferred        Extremities: Normal range of motion.  Edema: None  Mental Status: Normal mood and affect. Normal behavior. Normal judgment and thought content.     Assessment   24 y.o. G1P0 at [redacted]w[redacted]d by  08/01/2020, by Ultrasound presenting for routine prenatal visit  Plan   pregnancy Problems (from  01/20/20 to present)    Problem Noted Resolved   Supervision of other normal pregnancy, antepartum 02/02/2020 by 04/03/2020, CNM No   Overview Addendum 04/26/2020 10:03 AM by 04/28/2020, MD    [facility] Prenatal Labs  Dating EDD by 14w u/s Blood type: A/Positive/-- (03/24 1544)   Genetic Screen Inheritest negative     NIPS: diploid XY Antibody:Negative (03/24 1544)  Anatomic 04-09-1986 Low lying placenta resolved, otherwise normal anatomy, female gender Rubella: 1.49 (03/24 1544) Varicella: Immune  GTT Third trimester:  RPR: Non Reactive (03/24 1544)   Rhogam  not needed HBsAg: Negative (03/24 1544)   TDaP vaccine   04/26/2020   Flu Shot: HIV: Non Reactive (03/24 1544)   Baby Food                                GBS:   Contraception  Pap: NIL/ +HRHPV  CBB     CS/VBAC    Support Person           Previous Version     TDAP today 1hr GTT today Low lying Placenta resolved  Gestational age appropriate obstetric precautions including but not limited to vaginal bleeding, contractions, leaking of fluid and fetal movement were reviewed in detail with the patient.    Return in about 2 weeks (around 05/10/2020) for ROB in person.  05/12/2020 MD [facility], [facility] 04/26/2020, 10:04 AM

## 2020-04-26 NOTE — Patient Instructions (Signed)
Recommended Weight Gain in Pregnancy     BMI    Underweight  Less than 18.5 28-40 lbs Normal weight  18.5-24.9  25-35 lbs Overweight  25-29.9  15-25 lbs Obese   30 and greater  11-20 lbs     Third Trimester of Pregnancy The third trimester is from week 28 through week 40 (months 7 through 9). The third trimester is a time when the unborn baby (fetus) is growing rapidly. At the end of the ninth month, the fetus is about 20 inches in length and weighs 6-10 pounds. Body changes during your third trimester Your body will continue to go through many changes during pregnancy. The changes vary from woman to woman. During the third trimester:  Your weight will continue to increase. You can expect to gain 25-35 pounds (11-16 kg) by the end of the pregnancy.  You may begin to get stretch marks on your hips, abdomen, and breasts.  You may urinate more often because the fetus is moving lower into your pelvis and pressing on your bladder.  You may develop or continue to have heartburn. This is caused by increased hormones that slow down muscles in the digestive tract.  You may develop or continue to have constipation because increased hormones slow digestion and cause the muscles that push waste through your intestines to relax.  You may develop hemorrhoids. These are swollen veins (varicose veins) in the rectum that can itch or be painful.  You may develop swollen, bulging veins (varicose veins) in your legs.  You may have increased body aches in the pelvis, back, or thighs. This is due to weight gain and increased hormones that are relaxing your joints.  You may have changes in your hair. These can include thickening of your hair, rapid growth, and changes in texture. Some women also have hair loss during or after pregnancy, or hair that feels dry or thin. Your hair will most likely return to normal after your baby is born.  Your breasts will continue to grow and they will continue to become  tender. A yellow fluid (colostrum) may leak from your breasts. This is the first milk you are producing for your baby.  Your belly button may stick out.  You may notice more swelling in your hands, face, or ankles.  You may have increased tingling or numbness in your hands, arms, and legs. The skin on your belly may also feel numb.  You may feel short of breath because of your expanding uterus.  You may have more problems sleeping. This can be caused by the size of your belly, increased need to urinate, and an increase in your body's metabolism.  You may notice the fetus "dropping," or moving lower in your abdomen (lightening).  You may have increased vaginal discharge.  You may notice your joints feel loose and you may have pain around your pelvic bone. What to expect at prenatal visits You will have prenatal exams every 2 weeks until week 36. Then you will have weekly prenatal exams. During a routine prenatal visit:  You will be weighed to make sure you and the baby are growing normally.  Your blood pressure will be taken.  Your abdomen will be measured to track your baby's growth.  The fetal heartbeat will be listened to.  Any test results from the previous visit will be discussed.  You may have a cervical check near your due date to see if your cervix has softened or thinned (effaced).  You will  be tested for Group B streptococcus. This happens between 35 and 37 weeks. Your health care provider may ask you:  What your birth plan is.  How you are feeling.  If you are feeling the baby move.  If you have had any abnormal symptoms, such as leaking fluid, bleeding, severe headaches, or abdominal cramping.  If you are using any tobacco products, including cigarettes, chewing tobacco, and electronic cigarettes.  If you have any questions. Other tests or screenings that may be performed during your third trimester include:  Blood tests that check for low iron levels  (anemia).  Fetal testing to check the health, activity level, and growth of the fetus. Testing is done if you have certain medical conditions or if there are problems during the pregnancy.  Nonstress test (NST). This test checks the health of your baby to make sure there are no signs of problems, such as the baby not getting enough oxygen. During this test, a belt is placed around your belly. The baby is made to move, and its heart rate is monitored during movement. What is false labor? False labor is a condition in which you feel small, irregular tightenings of the muscles in the womb (contractions) that usually go away with rest, changing position, or drinking water. These are called Braxton Hicks contractions. Contractions may last for hours, days, or even weeks before true labor sets in. If contractions come at regular intervals, become more frequent, increase in intensity, or become painful, you should see your health care provider. What are the signs of labor?  Abdominal cramps.  Regular contractions that start at 10 minutes apart and become stronger and more frequent with time.  Contractions that start on the top of the uterus and spread down to the lower abdomen and back.  Increased pelvic pressure and dull back pain.  A watery or bloody mucus discharge that comes from the vagina.  Leaking of amniotic fluid. This is also known as your "water breaking." It could be a slow trickle or a gush. Let your health care provider know if it has a color or strange odor. If you have any of these signs, call your health care provider right away, even if it is before your due date. Follow these instructions at home: Medicines  Follow your health care provider's instructions regarding medicine use. Specific medicines may be either safe or unsafe to take during pregnancy.  Take a prenatal vitamin that contains at least 600 micrograms (mcg) of folic acid.  If you develop constipation, try taking a  stool softener if your health care provider approves. Eating and drinking   Eat a balanced diet that includes fresh fruits and vegetables, whole grains, good sources of protein such as meat, eggs, or tofu, and low-fat dairy. Your health care provider will help you determine the amount of weight gain that is right for you.  Avoid raw meat and uncooked cheese. These carry germs that can cause birth defects in the baby.  If you have low calcium intake from food, talk to your health care provider about whether you should take a daily calcium supplement.  Eat four or five small meals rather than three large meals a day.  Limit foods that are high in fat and processed sugars, such as fried and sweet foods.  To prevent constipation: ? Drink enough fluid to keep your urine clear or pale yellow. ? Eat foods that are high in fiber, such as fresh fruits and vegetables, whole grains, and beans.  Activity  Exercise only as directed by your health care provider. Most women can continue their usual exercise routine during pregnancy. Try to exercise for 30 minutes at least 5 days a week. Stop exercising if you experience uterine contractions.  Avoid heavy lifting.  Do not exercise in extreme heat or humidity, or at high altitudes.  Wear low-heel, comfortable shoes.  Practice good posture.  You may continue to have sex unless your health care provider tells you otherwise. Relieving pain and discomfort  Take frequent breaks and rest with your legs elevated if you have leg cramps or low back pain.  Take warm sitz baths to soothe any pain or discomfort caused by hemorrhoids. Use hemorrhoid cream if your health care provider approves.  Wear a good support bra to prevent discomfort from breast tenderness.  If you develop varicose veins: ? Wear support pantyhose or compression stockings as told by your healthcare provider. ? Elevate your feet for 15 minutes, 3-4 times a day. Prenatal care  Write  down your questions. Take them to your prenatal visits.  Keep all your prenatal visits as told by your health care provider. This is important. Safety  Wear your seat belt at all times when driving.  Make a list of emergency phone numbers, including numbers for family, friends, the hospital, and police and fire departments. General instructions  Avoid cat litter boxes and soil used by cats. These carry germs that can cause birth defects in the baby. If you have a cat, ask someone to clean the litter box for you.  Do not travel far distances unless it is absolutely necessary and only with the approval of your health care provider.  Do not use hot tubs, steam rooms, or saunas.  Do not drink alcohol.  Do not use any products that contain nicotine or tobacco, such as cigarettes and e-cigarettes. If you need help quitting, ask your health care provider.  Do not use any medicinal herbs or unprescribed drugs. These chemicals affect the formation and growth of the baby.  Do not douche or use tampons or scented sanitary pads.  Do not cross your legs for long periods of time.  To prepare for the arrival of your baby: ? Take prenatal classes to understand, practice, and ask questions about labor and delivery. ? Make a trial run to the hospital. ? Visit the hospital and tour the maternity area. ? Arrange for maternity or paternity leave through employers. ? Arrange for family and friends to take care of pets while you are in the hospital. ? Purchase a rear-facing car seat and make sure you know how to install it in your car. ? Pack your hospital bag. ? Prepare the baby's nursery. Make sure to remove all pillows and stuffed animals from the baby's crib to prevent suffocation.  Visit your dentist if you have not gone during your pregnancy. Use a soft toothbrush to brush your teeth and be gentle when you floss. Contact a health care provider if:  You are unsure if you are in labor or if your  water has broken.  You become dizzy.  You have mild pelvic cramps, pelvic pressure, or nagging pain in your abdominal area.  You have lower back pain.  You have persistent nausea, vomiting, or diarrhea.  You have an unusual or bad smelling vaginal discharge.  You have pain when you urinate. Get help right away if:  Your water breaks before 37 weeks.  You have regular contractions less than 5 minutes  apart before 37 weeks.  You have a fever.  You are leaking fluid from your vagina.  You have spotting or bleeding from your vagina.  You have severe abdominal pain or cramping.  You have rapid weight loss or weight gain.  You have shortness of breath with chest pain.  You notice sudden or extreme swelling of your face, hands, ankles, feet, or legs.  Your baby makes fewer than 10 movements in 2 hours.  You have severe headaches that do not go away when you take medicine.  You have vision changes. Summary  The third trimester is from week 28 through week 40, months 7 through 9. The third trimester is a time when the unborn baby (fetus) is growing rapidly.  During the third trimester, your discomfort may increase as you and your baby continue to gain weight. You may have abdominal, leg, and back pain, sleeping problems, and an increased need to urinate.  During the third trimester your breasts will keep growing and they will continue to become tender. A yellow fluid (colostrum) may leak from your breasts. This is the first milk you are producing for your baby.  False labor is a condition in which you feel small, irregular tightenings of the muscles in the womb (contractions) that eventually go away. These are called Braxton Hicks contractions. Contractions may last for hours, days, or even weeks before true labor sets in.  Signs of labor can include: abdominal cramps; regular contractions that start at 10 minutes apart and become stronger and more frequent with time; watery or  bloody mucus discharge that comes from the vagina; increased pelvic pressure and dull back pain; and leaking of amniotic fluid. This information is not intended to replace advice given to you by your health care provider. Make sure you discuss any questions you have with your health care provider. Document Revised: 02/05/2019 Document Reviewed: 11/20/2016 Elsevier Patient Education  2020 [employer].

## 2020-04-27 LAB — 28 WEEK RH+PANEL
Basophils Absolute: 0 10*3/uL (ref 0.0–0.2)
Basos: 1 %
EOS (ABSOLUTE): 0.1 10*3/uL (ref 0.0–0.4)
Eos: 2 %
Gestational Diabetes Screen: 81 mg/dL (ref 65–139)
HIV Screen 4th Generation wRfx: NONREACTIVE
Hematocrit: 32.9 % — ABNORMAL LOW (ref 34.0–46.6)
Hemoglobin: 11.1 g/dL (ref 11.1–15.9)
Immature Grans (Abs): 0.1 10*3/uL (ref 0.0–0.1)
Immature Granulocytes: 1 %
Lymphocytes Absolute: 1.3 10*3/uL (ref 0.7–3.1)
Lymphs: 21 %
MCH: 32 pg (ref 26.6–33.0)
MCHC: 33.7 g/dL (ref 31.5–35.7)
MCV: 95 fL (ref 79–97)
Monocytes Absolute: 0.6 10*3/uL (ref 0.1–0.9)
Monocytes: 9 %
Neutrophils Absolute: 4.1 10*3/uL (ref 1.4–7.0)
Neutrophils: 66 %
Platelets: 183 10*3/uL (ref 150–450)
RBC: 3.47 x10E6/uL — ABNORMAL LOW (ref 3.77–5.28)
RDW: 12.9 % (ref 11.7–15.4)
RPR Ser Ql: NONREACTIVE
WBC: 6.3 10*3/uL (ref 3.4–10.8)

## 2020-05-10 ENCOUNTER — Other Ambulatory Visit: Payer: Self-pay

## 2020-05-10 ENCOUNTER — Ambulatory Visit (INDEPENDENT_AMBULATORY_CARE_PROVIDER_SITE_OTHER): Payer: Medicaid Other | Admitting: Advanced Practice Midwife

## 2020-05-10 ENCOUNTER — Encounter: Payer: Self-pay | Admitting: Advanced Practice Midwife

## 2020-05-10 VITALS — BP 120/80 | Wt 178.0 lb

## 2020-05-10 DIAGNOSIS — Z3A28 28 weeks gestation of pregnancy: Secondary | ICD-10-CM

## 2020-05-10 DIAGNOSIS — Z3483 Encounter for supervision of other normal pregnancy, third trimester: Secondary | ICD-10-CM

## 2020-05-10 LAB — POCT URINALYSIS DIPSTICK OB
Glucose, UA: NEGATIVE
POC,PROTEIN,UA: NEGATIVE

## 2020-05-10 NOTE — Progress Notes (Signed)
  Routine Prenatal Care Visit  Subjective  Rebekah Dennis is a 24 y.o. G1P0 at [redacted]w[redacted]d being seen today for ongoing prenatal care.  She is currently monitored for the following issues for this low-risk pregnancy and has Chronic dermatitis; Supervision of other normal pregnancy, antepartum; Low-lying placenta in second trimester; and COVID-19 virus infection on their problem list.  ----------------------------------------------------------------------------------- Patient reports backache.  We reviewed comfort measures. Discussed birth control options and she is unsure which method she will use. She asks about paternity testing and that can be done after the baby is born.  Contractions: Not present. Vag. Bleeding: None.  Movement: Present. Leaking Fluid denies.  ----------------------------------------------------------------------------------- The following portions of the patient's history were reviewed and updated as appropriate: allergies, current medications, past family history, past medical history, past social history, past surgical history and problem list. Problem list updated.  Objective  Blood pressure 120/80, weight 178 lb (80.7 kg), last menstrual period 08/29/2019. Pregravid weight 145 lb (65.8 kg) Total Weight Gain 33 lb (15 kg) Urinalysis: Urine Protein    Urine Glucose    Fetal Status: Fetal Heart Rate (bpm): 144 Fundal Height: 28 cm Movement: Present     General:  Alert, oriented and cooperative. Patient is in no acute distress.  Skin: Skin is warm and dry. No rash noted.   Cardiovascular: Normal heart rate noted  Respiratory: Normal respiratory effort, no problems with respiration noted  Abdomen: Soft, gravid, appropriate for gestational age. Pain/Pressure: Absent     Pelvic:  Cervical exam deferred        Extremities: Normal range of motion.  Edema: None  Mental Status: Normal mood and affect. Normal behavior. Normal judgment and thought content.   Assessment   25 y.o.  G1P0 at [redacted]w[redacted]d by  08/01/2020, by Ultrasound presenting for routine prenatal visit  Plan   pregnancy Problems (from 01/20/20 to present)    Problem Noted Resolved   Supervision of other normal pregnancy, antepartum 02/02/2020 by Tresea Mall, CNM No   Overview Addendum 04/26/2020 10:03 AM by Natale Milch, MD    [facility] Prenatal Labs  Dating EDD by 14w u/s Blood type: A/Positive/-- (03/24 1544)   Genetic Screen Inheritest negative     NIPS: diploid XY Antibody:Negative (03/24 1544)  Anatomic [place] Low lying placenta, otherwise normal anatomy, female gender Rubella: 1.49 (03/24 1544) Varicella: Immune  GTT Third trimester:  RPR: Non Reactive (03/24 1544)   Rhogam  not needed HBsAg: Negative (03/24 1544)   TDaP vaccine   04/26/2020   Flu Shot: HIV: Non Reactive (03/24 1544)   Baby Food                                GBS:   Contraception  Pap: NIL/ +HRHPV  CBB     CS/VBAC    Support Person           Previous Version       Preterm labor symptoms and general obstetric precautions including but not limited to vaginal bleeding, contractions, leaking of fluid and fetal movement were reviewed in detail with the patient.    Return in about 2 weeks (around 05/24/2020) for rob.  Tresea Mall, CNM 05/10/2020 3:18 PM

## 2020-05-24 ENCOUNTER — Other Ambulatory Visit: Payer: Self-pay

## 2020-05-24 ENCOUNTER — Ambulatory Visit (INDEPENDENT_AMBULATORY_CARE_PROVIDER_SITE_OTHER): Payer: Medicaid Other | Admitting: Obstetrics

## 2020-05-24 VITALS — BP 100/60 | Wt 177.0 lb

## 2020-05-24 DIAGNOSIS — Z3A30 30 weeks gestation of pregnancy: Secondary | ICD-10-CM

## 2020-05-24 DIAGNOSIS — Z3483 Encounter for supervision of other normal pregnancy, third trimester: Secondary | ICD-10-CM

## 2020-05-24 LAB — POCT URINALYSIS DIPSTICK OB
Glucose, UA: NEGATIVE
POC,PROTEIN,UA: NEGATIVE

## 2020-05-24 NOTE — Progress Notes (Signed)
ROB- no concerns 

## 2020-05-24 NOTE — Progress Notes (Signed)
  Routine Prenatal Care Visit  Subjective  Rebekah Dennis is a 24 y.o. G1P0 at [redacted]w[redacted]d being seen today for ongoing prenatal care.  She is currently monitored for the following issues for this low-risk pregnancy and has Chronic dermatitis; Supervision of other normal pregnancy, antepartum; Low-lying placenta in second trimester; and COVID-19 virus infection on their problem list.  ----------------------------------------------------------------------------------- Patient reports no complaints.    .  .   [name] denies.  ----------------------------------------------------------------------------------- The following portions of the patient's history were reviewed and updated as appropriate: allergies, current medications, past family history, past medical history, past social history, past surgical history and problem list. Problem list updated.  Objective  Blood pressure (!) 100/60, weight 177 lb (80.3 kg), last menstrual period 08/29/2019. Pregravid weight 145 lb (65.8 kg) Total Weight Gain 32 lb (14.5 kg) Urinalysis: Urine Protein Negative  Urine Glucose Negative  Fetal Status:           General:  Alert, oriented and cooperative. Patient is in no acute distress.  Skin: Skin is warm and dry. No rash noted.   Cardiovascular: Normal heart rate noted  Respiratory: Normal respiratory effort, no problems with respiration noted  Abdomen: Soft, gravid, appropriate for gestational age.       Pelvic:  Cervical exam deferred        Extremities: Normal range of motion.     Mental Status: Normal mood and affect. Normal behavior. Normal judgment and thought content.   Assessment   24 y.o. G1P0 at [redacted]w[redacted]d by  08/01/2020, by Ultrasound presenting for routine prenatal visit  Plan   pregnancy Problems (from 01/20/20 to present)    Problem Noted Resolved   Supervision of other normal pregnancy, antepartum 02/02/2020 by Tresea Mall, CNM No   Overview Addendum 04/26/2020 10:03 AM by Natale Milch, MD    [facility] Prenatal Labs  Dating EDD by 14w u/s Blood type: A/Positive/-- (03/24 1544)   Genetic Screen Inheritest negative     NIPS: diploid XY Antibody:Negative (03/24 1544)  Anatomic [place] Low lying placenta, otherwise normal anatomy, female gender Rubella: 1.49 (03/24 1544) Varicella: Immune  GTT Third trimester:  RPR: Non Reactive (03/24 1544)   Rhogam  not needed HBsAg: Negative (03/24 1544)   TDaP vaccine   04/26/2020   Flu Shot: HIV: Non Reactive (03/24 1544)   Baby Food                                GBS:   Contraception  Pap: NIL/ +HRHPV  CBB     CS/VBAC    Support Person           Previous Version       Preterm labor symptoms and general obstetric precautions including but not limited to vaginal bleeding, contractions, leaking of fluid and fetal movement were reviewed in detail with the patient. Please refer to After Visit Summary for other counseling recommendations.   Return in about 2 weeks (around 06/07/2020) for return OB.  Mirna Mires, CNM  05/24/2020 3:38 PM

## 2020-06-10 ENCOUNTER — Ambulatory Visit (INDEPENDENT_AMBULATORY_CARE_PROVIDER_SITE_OTHER): Payer: Medicaid Other | Admitting: Advanced Practice Midwife

## 2020-06-10 ENCOUNTER — Encounter: Payer: Self-pay | Admitting: Advanced Practice Midwife

## 2020-06-10 ENCOUNTER — Other Ambulatory Visit: Payer: Self-pay

## 2020-06-10 VITALS — BP 130/80 | Wt 180.0 lb

## 2020-06-10 DIAGNOSIS — Z3483 Encounter for supervision of other normal pregnancy, third trimester: Secondary | ICD-10-CM

## 2020-06-10 DIAGNOSIS — Z3A32 32 weeks gestation of pregnancy: Secondary | ICD-10-CM

## 2020-06-10 DIAGNOSIS — Z3480 Encounter for supervision of other normal pregnancy, unspecified trimester: Secondary | ICD-10-CM

## 2020-06-10 LAB — POCT URINALYSIS DIPSTICK OB
Glucose, UA: NEGATIVE
POC,PROTEIN,UA: NEGATIVE

## 2020-06-10 NOTE — Progress Notes (Signed)
ROB NO concerns

## 2020-06-10 NOTE — Progress Notes (Signed)
  Routine Prenatal Care Visit  Subjective  Rebekah Dennis is a 24 y.o. G1P0 at [redacted]w[redacted]d being seen today for ongoing prenatal care.  She is currently monitored for the following issues for this low-risk pregnancy and has Chronic dermatitis; Supervision of other normal pregnancy, antepartum; Low-lying placenta in second trimester; and COVID-19 virus infection on their problem list.  ----------------------------------------------------------------------------------- Patient reports no complaints.   Contractions: Not present. Vag. Bleeding: None.  Movement: Present. Leaking Fluid denies.  ----------------------------------------------------------------------------------- The following portions of the patient's history were reviewed and updated as appropriate: allergies, current medications, past family history, past medical history, past social history, past surgical history and problem list. Problem list updated.  Objective  Blood pressure 130/80, weight 180 lb (81.6 kg), last menstrual period 08/29/2019. Pregravid weight 145 lb (65.8 kg) Total Weight Gain 35 lb (15.9 kg) Urinalysis: Urine Protein Negative  Urine Glucose Negative  Fetal Status: Fetal Heart Rate (bpm): 158 Fundal Height: 32 cm Movement: Present     General:  Alert, oriented and cooperative. Patient is in no acute distress.  Skin: Skin is warm and dry. No rash noted.   Cardiovascular: Normal heart rate noted  Respiratory: Normal respiratory effort, no problems with respiration noted  Abdomen: Soft, gravid, appropriate for gestational age. Pain/Pressure: Absent     Pelvic:  Cervical exam deferred        Extremities: Normal range of motion.  Edema: None  Mental Status: Normal mood and affect. Normal behavior. Normal judgment and thought content.   Assessment   24 y.o. G1P0 at [redacted]w[redacted]d by  08/01/2020, by Ultrasound presenting for routine prenatal visit  Plan   pregnancy Problems (from 01/20/20 to present)    Problem Noted Resolved    Supervision of other normal pregnancy, antepartum 02/02/2020 by Tresea Mall, CNM No   Overview Addendum 04/26/2020 10:03 AM by Natale Milch, MD    [facility] Prenatal Labs  Dating EDD by 14w u/s Blood type: A/Positive/-- (03/24 1544)   Genetic Screen Inheritest negative     NIPS: diploid XY Antibody:Negative (03/24 1544)  Anatomic [place] Low lying placenta, otherwise normal anatomy, female gender Rubella: 1.49 (03/24 1544) Varicella: Immune  GTT Third trimester:  RPR: Non Reactive (03/24 1544)   Rhogam  not needed HBsAg: Negative (03/24 1544)   TDaP vaccine   04/26/2020   Flu Shot: HIV: Non Reactive (03/24 1544)   Baby Food                                GBS:   Contraception  Pap: NIL/ +HRHPV  CBB     CS/VBAC    Support Person           Previous Version       Preterm labor symptoms and general obstetric precautions including but not limited to vaginal bleeding, contractions, leaking of fluid and fetal movement were reviewed in detail with the patient.    Return in about 2 weeks (around 06/24/2020) for rob.  Tresea Mall, CNM 06/10/2020 3:33 PM

## 2020-06-24 ENCOUNTER — Other Ambulatory Visit: Payer: Self-pay

## 2020-06-24 ENCOUNTER — Ambulatory Visit (INDEPENDENT_AMBULATORY_CARE_PROVIDER_SITE_OTHER): Payer: Medicaid Other | Admitting: Obstetrics

## 2020-06-24 VITALS — BP 121/66 | Wt 182.0 lb

## 2020-06-24 DIAGNOSIS — Z3483 Encounter for supervision of other normal pregnancy, third trimester: Secondary | ICD-10-CM

## 2020-06-24 DIAGNOSIS — Z3480 Encounter for supervision of other normal pregnancy, unspecified trimester: Secondary | ICD-10-CM

## 2020-06-24 DIAGNOSIS — Z3A34 34 weeks gestation of pregnancy: Secondary | ICD-10-CM

## 2020-06-24 LAB — POCT URINALYSIS DIPSTICK OB
Glucose, UA: NEGATIVE
POC,PROTEIN,UA: NEGATIVE

## 2020-06-24 NOTE — Progress Notes (Signed)
No concerns.rj 

## 2020-06-24 NOTE — Progress Notes (Signed)
  Routine Prenatal Care Visit  Subjective  Rebekah Dennis is a 24 y.o. G1P0 at [redacted]w[redacted]d being seen today for ongoing prenatal care.  She is currently monitored for the following issues for this low-risk pregnancy and has Chronic dermatitis; Supervision of other normal pregnancy, antepartum; Low-lying placenta in second trimester; and COVID-19 virus infection on their problem list.  ----------------------------------------------------------------------------------- Patient reports no bleeding, no contractions, no cramping and no leaking.    .  .   [name] denies.  ----------------------------------------------------------------------------------- The following portions of the patient's history were reviewed and updated as appropriate: allergies, current medications, past family history, past medical history, past social history, past surgical history and problem list. Problem list updated.  Objective  Blood pressure 121/66, weight 182 lb (82.6 kg), last menstrual period 08/29/2019. Pregravid weight 145 lb (65.8 kg) Total Weight Gain 37 lb (16.8 kg) Urinalysis: Urine Protein Negative  Urine Glucose Negative  Fetal Status:           General:  Alert, oriented and cooperative. Patient is in no acute distress.  Skin: Skin is warm and dry. No rash noted.   Cardiovascular: Normal heart rate noted  Respiratory: Normal respiratory effort, no problems with respiration noted  Abdomen: Soft, gravid, appropriate for gestational age.       Pelvic:  Cervical exam deferred        Extremities: Normal range of motion.     Mental Status: Normal mood and affect. Normal behavior. Normal judgment and thought content.   Assessment   24 y.o. G1P0 at [redacted]w[redacted]d by  08/01/2020, by Ultrasound presenting for routine prenatal visit  Plan   pregnancy Problems (from 01/20/20 to present)    Problem Noted Resolved   Supervision of other normal pregnancy, antepartum 02/02/2020 by Tresea Mall, CNM No   Overview  Addendum 04/26/2020 10:03 AM by Natale Milch, MD    [facility] Prenatal Labs  Dating EDD by 14w u/s Blood type: A/Positive/-- (03/24 1544)   Genetic Screen Inheritest negative     NIPS: diploid XY Antibody:Negative (03/24 1544)  Anatomic [place] Low lying placenta, otherwise normal anatomy, female gender Rubella: 1.49 (03/24 1544) Varicella: Immune  GTT Third trimester:  RPR: Non Reactive (03/24 1544)   Rhogam  not needed HBsAg: Negative (03/24 1544)   TDaP vaccine   04/26/2020   Flu Shot: HIV: Non Reactive (03/24 1544)   Baby Food                                GBS:   Contraception  Pap: NIL/ +HRHPV  CBB     CS/VBAC    Support Person           Previous Version       Preterm labor symptoms and general obstetric precautions including but not limited to vaginal bleeding, contractions, leaking of fluid and fetal movement were reviewed in detail with the patient. Please refer to After Visit Summary for other counseling recommendations.  Discussed GBS testing at 37 weeks. She is doing well.  Return in about 2 weeks (around 07/08/2020) for return OB.  Mirna Mires, CNM  06/24/2020 8:32 AM

## 2020-07-08 ENCOUNTER — Ambulatory Visit (INDEPENDENT_AMBULATORY_CARE_PROVIDER_SITE_OTHER): Payer: Medicaid Other | Admitting: Obstetrics

## 2020-07-08 ENCOUNTER — Other Ambulatory Visit: Payer: Self-pay

## 2020-07-08 VITALS — BP 120/70 | Wt 184.0 lb

## 2020-07-08 DIAGNOSIS — Z3A36 36 weeks gestation of pregnancy: Secondary | ICD-10-CM

## 2020-07-08 DIAGNOSIS — Z3483 Encounter for supervision of other normal pregnancy, third trimester: Secondary | ICD-10-CM

## 2020-07-08 DIAGNOSIS — Z3480 Encounter for supervision of other normal pregnancy, unspecified trimester: Secondary | ICD-10-CM

## 2020-07-08 LAB — POCT URINALYSIS DIPSTICK OB
Glucose, UA: NEGATIVE
POC,PROTEIN,UA: NEGATIVE

## 2020-07-08 NOTE — Progress Notes (Signed)
No concern.rj 

## 2020-07-08 NOTE — Progress Notes (Signed)
  Routine Prenatal Care Visit  Subjective  Rebekah Dennis is a 24 y.o. G1P0 at [redacted]w[redacted]d being seen today for ongoing prenatal care.  She is currently monitored for the following issues for this low-risk pregnancy and has Chronic dermatitis; Supervision of other normal pregnancy, antepartum; Low-lying placenta in second trimester; and COVID-19 virus infection on their problem list.  ----------------------------------------------------------------------------------- Patient reports no complaints.    .  .   Rebekah Dennis denies.  ----------------------------------------------------------------------------------- The following portions of the patient's history were reviewed and updated as appropriate: allergies, current medications, past family history, past medical history, past social history, past surgical history and problem list. Problem list updated.  Objective  Blood pressure 120/70, weight 184 lb (83.5 kg), last menstrual period 08/29/2019. Pregravid weight 145 lb (65.8 kg) Total Weight Gain 39 lb (17.7 kg) Urinalysis: Urine Protein Negative  Urine Glucose Negative  Fetal Status:           General:  Alert, oriented and cooperative. Patient is in no acute distress.  Skin: Skin is warm and dry. No rash noted.   Cardiovascular: Normal heart rate noted  Respiratory: Normal respiratory effort, no problems with respiration noted  Abdomen: Soft, gravid, appropriate for gestational age.       Pelvic:  Cervical exam performed       1.5 cms/30%/ballotable. Cervix is posterior and firm  Extremities: Normal range of motion.     Mental Status: Normal mood and affect. Normal behavior. Normal judgment and thought content.   Assessment   24 y.o. G1P0 at [redacted]w[redacted]d by  08/01/2020, by Ultrasound presenting for routine prenatal visit  Plan   pregnancy Problems (from 01/20/20 to present)    Problem Noted Resolved   Supervision of other normal pregnancy, antepartum 02/02/2020 by Tresea Mall, CNM No    Overview Addendum 07/08/2020  9:50 AM by Mirna Mires, CNM    [facility] Prenatal Labs  Dating EDD by 14w u/s Blood type: A/Positive/-- (03/24 1544)   Genetic Screen Inheritest negative     NIPS: diploid XY Antibody:Negative (03/24 1544)  Anatomic [place] Low lying placenta, otherwise normal anatomy, female gender Rubella: 1.49 (03/24 1544) Varicella: Immune  GTT Third trimester:  RPR: Non Reactive (03/24 1544)   Rhogam  not needed HBsAg: Negative (03/24 1544)   TDaP vaccine   04/26/2020   Flu Shot: HIV: Non Reactive (03/24 1544)   Baby Food                                GBS: 07/08/20  Contraception  Pap: NIL/ +HRHPV  CBB     CS/VBAC    Support Person           Previous Version       Term labor symptoms and general obstetric precautions including but not limited to vaginal bleeding, contractions, leaking of Dennis and fetal movement were reviewed in detail with the patient. Please refer to After Visit Summary for other counseling recommendations.  GBS cx and GC/CT retrieved today  Return in about 1 week (around 07/15/2020) for return OB.  Mirna Mires, CNM  07/08/2020 9:52 AM

## 2020-07-11 LAB — GC/CHLAMYDIA PROBE AMP
Chlamydia trachomatis, NAA: NEGATIVE
Neisseria Gonorrhoeae by PCR: NEGATIVE

## 2020-07-12 LAB — CULTURE, BETA STREP (GROUP B ONLY): Strep Gp B Culture: NEGATIVE

## 2020-07-14 ENCOUNTER — Ambulatory Visit (INDEPENDENT_AMBULATORY_CARE_PROVIDER_SITE_OTHER): Payer: Medicaid Other | Admitting: Obstetrics

## 2020-07-14 ENCOUNTER — Other Ambulatory Visit: Payer: Self-pay

## 2020-07-14 VITALS — BP 114/70 | Ht 69.0 in | Wt 188.2 lb

## 2020-07-14 DIAGNOSIS — Z3A37 37 weeks gestation of pregnancy: Secondary | ICD-10-CM | POA: Diagnosis not present

## 2020-07-14 DIAGNOSIS — Z3483 Encounter for supervision of other normal pregnancy, third trimester: Secondary | ICD-10-CM

## 2020-07-14 LAB — POCT URINALYSIS DIPSTICK OB: Glucose, UA: NEGATIVE

## 2020-07-14 NOTE — Progress Notes (Signed)
  Routine Prenatal Care Visit  Subjective  Rebekah Dennis is a 24 y.o. G1P0 at [redacted]w[redacted]d being seen today for ongoing prenatal care.  She is currently monitored for the following issues for this low-risk pregnancy and has Chronic dermatitis; Supervision of other normal pregnancy, antepartum; and COVID-19 virus infection on their problem list.  ----------------------------------------------------------------------------------- Patient reports no complaints.   Contractions: Not present. Vag. Bleeding: None.  Movement: Present. Leaking Fluid denies.  ----------------------------------------------------------------------------------- The following portions of the patient's history were reviewed and updated as appropriate: allergies, current medications, past family history, past medical history, past social history, past surgical history and problem list. Problem list updated.  Objective  Blood pressure 114/70, height 5\' 9"  (1.753 m), weight 188 lb 3.2 oz (85.4 kg), last menstrual period 08/29/2019. Pregravid weight 145 lb (65.8 kg) Total Weight Gain 43 lb 3.2 oz (19.6 kg) Urinalysis: Urine Protein    Urine Glucose    Fetal Status:     Movement: Present     General:  Alert, oriented and cooperative. Patient is in no acute distress.  Skin: Skin is warm and dry. No rash noted.   Cardiovascular: Normal heart rate noted  Respiratory: Normal respiratory effort, no problems with respiration noted  Abdomen: Soft, gravid, appropriate for gestational age. Pain/Pressure: Present     Pelvic:  Cervical exam performed        Extremities: Normal range of motion.     Mental Status: Normal mood and affect. Normal behavior. Normal judgment and thought content.   Assessment   24 y.o. G1P0 at [redacted]w[redacted]d by  08/01/2020, by Ultrasound presenting for routine prenatal visit  Plan   pregnancy Problems (from 01/20/20 to present)    Problem Noted Resolved   Supervision of other normal pregnancy, antepartum 02/02/2020 by  04/03/2020, CNM No   Overview Addendum 07/12/2020  8:43 AM by 07/14/2020, CNM    [facility] Prenatal Labs  Dating EDD by 14w u/s Blood type: A/Positive/-- (03/24 1544)   Genetic Screen Inheritest negative     NIPS: diploid XY Antibody:Negative (03/24 1544)  Anatomic 04-09-1986 Low lying placenta, otherwise normal anatomy, female gender Rubella: 1.49 (03/24 1544) Varicella: Immune  GTT Third trimester:  RPR: Non Reactive (03/24 1544)   Rhogam  not needed HBsAg: Negative (03/24 1544)   TDaP vaccine   04/26/2020   Flu Shot: HIV: Non Reactive (03/24 1544)   Baby Food                                GBS: 07/08/20 negative  Contraception  Pap: NIL/ +HRHPV  CBB     CS/VBAC    Support Person           Previous Version       Term labor symptoms and general obstetric precautions including but not limited to vaginal bleeding, contractions, leaking of fluid and fetal movement were reviewed in detail with the patient. Please refer to After Visit Summary for other counseling recommendations.   No follow-ups on file.  09/07/20, CNM  07/14/2020 2:44 PM

## 2020-07-21 ENCOUNTER — Encounter: Payer: Self-pay | Admitting: Advanced Practice Midwife

## 2020-07-21 ENCOUNTER — Ambulatory Visit (INDEPENDENT_AMBULATORY_CARE_PROVIDER_SITE_OTHER): Payer: Medicaid Other | Admitting: Advanced Practice Midwife

## 2020-07-21 ENCOUNTER — Other Ambulatory Visit: Payer: Self-pay

## 2020-07-21 VITALS — BP 118/74 | Wt 190.0 lb

## 2020-07-21 DIAGNOSIS — Z3A38 38 weeks gestation of pregnancy: Secondary | ICD-10-CM

## 2020-07-21 DIAGNOSIS — Z3480 Encounter for supervision of other normal pregnancy, unspecified trimester: Secondary | ICD-10-CM

## 2020-07-21 NOTE — Patient Instructions (Signed)
Pain Relief During Labor and Delivery Many things can cause pain during labor and delivery, including:  Pressure on bones and ligaments due to the baby moving through the pelvis.  Stretching of tissues due to the baby moving through the birth canal.  Muscle tension due to anxiety or nervousness.  The uterus tightening (contracting) and relaxing to help move the baby. There are many ways to deal with the pain of labor and delivery. They include:  Taking prenatal classes. Taking these classes helps you know what to expect during your baby's birth. What you learn will increase your confidence and decrease your anxiety.  Practicing relaxation techniques or doing relaxing activities, such as: ? Focused breathing. ? Meditation. ? Visualization. ? Aroma therapy. ? Listening to your favorite music. ? Hypnosis.  Taking a warm shower or bath (hydrotherapy). This may: ? Provide comfort and relaxation. ? Lessen your perception of pain. ? Decrease the amount of pain medicine needed. ? Decrease the length of labor.  Getting a massage or counterpressure on your back.  Applying warm packs or ice packs.  Changing positions often, moving around, or using a birthing ball.  Getting: ? Pain medicine through an IV or injection into a muscle. ? Pain medicine inserted into your spinal column. ? Injections of sterile water just under the skin on your lower back (intradermal injections). ? Laughing gas (nitrous oxide). Discuss your pain control options with your health care provider during your prenatal visits. Explore the options offered by your hospital or birth center. What kinds of medicine are available? There are two kinds of medicines that can be used to relieve pain during labor and delivery:  Analgesics. These medicines decrease pain without causing you to lose feeling or the ability to move your muscles.  Anesthetics. These medicines block feeling in the body and can decrease your  ability to move freely. Both of these kinds of medicine can cause minor side effects, such as nausea, trouble concentrating, and sleepiness. They can also decrease the baby's heart rate before birth and affect the baby's breathing rate after birth. For this reason, health care providers are careful about when and how much medicine is given. What are specific medicines and procedures that provide pain relief? Local Anesthetics Local anesthetics are used to numb a small area of the body. They may be used along with another kind of anesthetic or used to numb the nerves of the vagina, cervix, and perineum during the second stage of labor. General Anesthetics General anesthetics cause you to lose consciousness so you do not feel pain. They are usually only used for an emergency cesarean delivery. General anesthetics are given through an IV tube and a mask. Pudendal Block A pudendal block is a form of local anesthetic. It may be used to relieve the pain associated with pushing or stretching of the perineum at the time of delivery or to further numb the perineum. A pudendal block is done by injecting numbing medicine through the vaginal wall into a nerve in the pelvis. Epidural Analgesia Epidural analgesia is given through a flexible IV catheter that is inserted into the lower back. Numbing medicine is delivered continuously to the area near your spinal column nerves (epidural space). After having this type of analgesia, you may be able to move your legs but you most likely will not be able to walk. Depending on the amount of medicine given, you may lose all feeling in the lower half of your body, or you may retain some level   of sensation, including the urge to push. Epidural analgesia can be used to provide pain relief for a vaginal birth. Spinal Block A spinal block is similar to epidural analgesia, but the medicine is injected into the spinal fluid instead of the epidural space. A spinal block is only given  once. It starts to relieve pain quickly, but the pain relief lasts only 1-6 hours. Spinal blocks can be used for cesarean deliveries. Combined Spinal-Epidural (CSE) Block A CSE block combines the effects of a spinal block and epidural analgesia. The spinal block works quickly to block all pain. The epidural analgesia provides continuous pain relief, even after the effects of the spinal block have worn off. This information is not intended to replace advice given to you by your health care provider. Make sure you discuss any questions you have with your health care provider. Document Revised: 09/27/2017 Document Reviewed: 03/07/2016 Elsevier Patient Education  2020 [employer] Vaginal Delivery  Vaginal delivery means that you give birth by pushing your baby out of your birth canal (vagina). A team of health care providers will help you before, during, and after vaginal delivery. Birth experiences are unique for every woman and every pregnancy, and birth experiences vary depending on where you choose to give birth. What happens when I arrive at the birth center or hospital? Once you are in labor and have been admitted into the hospital or birth center, your health care provider may:  Review your pregnancy history and any concerns that you have.  Insert an IV into one of your veins. This may be used to give you fluids and medicines.  Check your blood pressure, pulse, temperature, and heart rate (vital signs).  Check whether your bag of water (amniotic sac) has broken (ruptured).  Talk with you about your birth plan and discuss pain control options. Monitoring Your health care provider may monitor your contractions (uterine monitoring) and your baby's heart rate (fetal monitoring). You may need to be monitored:  Often, but not continuously (intermittently).  All the time or for long periods at a time (continuously). Continuous monitoring may be needed if: ? You are taking certain medicines,  such as medicine to relieve pain or make your contractions stronger. ? You have pregnancy or labor complications. Monitoring may be done by:  Placing a special stethoscope or a handheld monitoring device on your abdomen to check your baby's heartbeat and to check for contractions.  Placing monitors on your abdomen (external monitors) to record your baby's heartbeat and the frequency and length of contractions.  Placing monitors inside your uterus through your vagina (internal monitors) to record your baby's heartbeat and the frequency, length, and strength of your contractions. Depending on the type of monitor, it may remain in your uterus or on your baby's head until birth.  Telemetry. This is a type of continuous monitoring that can be done with external or internal monitors. Instead of having to stay in bed, you are able to move around during telemetry. Physical exam Your health care provider may perform frequent physical exams. This may include:  Checking how and where your baby is positioned in your uterus.  Checking your cervix to determine: ? Whether it is thinning out (effacing). ? Whether it is opening up (dilating). What happens during labor and delivery?  Normal labor and delivery is divided into the following three stages: Stage 1  This is the longest stage of labor.  This stage can last for hours or days.  Throughout this stage,   you will feel contractions. Contractions generally feel mild, infrequent, and irregular at first. They get stronger, more frequent (about every 2-3 minutes), and more regular as you move through this stage.  This stage ends when your cervix is completely dilated to 4 inches (10 cm) and completely effaced. Stage 2  This stage starts once your cervix is completely effaced and dilated and lasts until the delivery of your baby.  This stage may last from 20 minutes to 2 hours.  This is the stage where you will feel an urge to push your baby out of  your vagina.  You may feel stretching and burning pain, especially when the widest part of your baby's head passes through the vaginal opening (crowning).  Once your baby is delivered, the umbilical cord will be clamped and cut. This usually occurs after waiting a period of 1-2 minutes after delivery.  Your baby will be placed on your bare chest (skin-to-skin contact) in an upright position and covered with a warm blanket. Watch your baby for feeding cues, like rooting or sucking, and help the baby to your breast for his or her first feeding. Stage 3  This stage starts immediately after the birth of your baby and ends after you deliver the placenta.  This stage may take anywhere from 5 to 30 minutes.  After your baby has been delivered, you will feel contractions as your body expels the placenta and your uterus contracts to control bleeding. What can I expect after labor and delivery?  After labor is over, you and your baby will be monitored closely until you are ready to go home to ensure that you are both healthy. Your health care team will teach you how to care for yourself and your baby.  You and your baby will stay in the same room (rooming in) during your hospital stay. This will encourage early bonding and successful breastfeeding.  You may continue to receive fluids and medicines through an IV.  Your uterus will be checked and massaged regularly (fundal massage).  You will have some soreness and pain in your abdomen, vagina, and the area of skin between your vaginal opening and your anus (perineum).  If an incision was made near your vagina (episiotomy) or if you had some vaginal tearing during delivery, cold compresses may be placed on your episiotomy or your tear. This helps to reduce pain and swelling.  You may be given a squirt bottle to use instead of wiping when you go to the bathroom. To use the squirt bottle, follow these steps: ? Before you urinate, fill the squirt  bottle with warm water. Do not use hot water. ? After you urinate, while you are sitting on the toilet, use the squirt bottle to rinse the area around your urethra and vaginal opening. This rinses away any urine and blood. ? Fill the squirt bottle with clean water every time you use the bathroom.  It is normal to have vaginal bleeding after delivery. Wear a sanitary pad for vaginal bleeding and discharge. Summary  Vaginal delivery means that you will give birth by pushing your baby out of your birth canal (vagina).  Your health care provider may monitor your contractions (uterine monitoring) and your baby's heart rate (fetal monitoring).  Your health care provider may perform a physical exam.  Normal labor and delivery is divided into three stages.  After labor is over, you and your baby will be monitored closely until you are ready to go home. This information   is not intended to replace advice given to you by your health care provider. Make sure you discuss any questions you have with your health care provider. Document Revised: 11/19/2017 Document Reviewed: 11/19/2017 Elsevier Patient Education  2020 [employer]  

## 2020-07-21 NOTE — Progress Notes (Signed)
No vb. No lof. Cervical check  

## 2020-07-21 NOTE — Progress Notes (Signed)
  Routine Prenatal Care Visit  Subjective  Rebekah Dennis is a 24 y.o. G1P0 at [redacted]w[redacted]d being seen today for ongoing prenatal care.  She is currently monitored for the following issues for this low-risk pregnancy and has Chronic dermatitis; Supervision of other normal pregnancy, antepartum; and COVID-19 virus infection on their problem list.  ----------------------------------------------------------------------------------- Patient reports no complaints.  She requests 39 week induction. Contractions: Not present. Vag. Bleeding: None.  Movement: Present. Leaking Fluid denies.  ----------------------------------------------------------------------------------- The following portions of the patient's history were reviewed and updated as appropriate: allergies, current medications, past family history, past medical history, past social history, past surgical history and problem list. Problem list updated.  Objective  Blood pressure 118/74, weight 190 lb (86.2 kg), last menstrual period 08/29/2019. Pregravid weight 145 lb (65.8 kg) Total Weight Gain 45 lb (20.4 kg) Urinalysis: Urine Protein    Urine Glucose    Fetal Status: Fetal Heart Rate (bpm): 145 Fundal Height: 37 cm Movement: Present  Presentation: Vertex  General:  Alert, oriented and cooperative. Patient is in no acute distress.  Skin: Skin is warm and dry. No rash noted.   Cardiovascular: Normal heart rate noted  Respiratory: Normal respiratory effort, no problems with respiration noted  Abdomen: Soft, gravid, appropriate for gestational age. Pain/Pressure: Absent     Pelvic:  Cervical exam performed Dilation: 2.5 Effacement (%): 60 Station: -2, cervical sweep  Extremities: Normal range of motion.  Edema: None  Mental Status: Normal mood and affect. Normal behavior. Normal judgment and thought content.   Assessment   24 y.o. G1P0 at [redacted]w[redacted]d by  08/01/2020, by Ultrasound presenting for routine prenatal visit  Plan   pregnancy Problems  (from 01/20/20 to present)    Problem Noted Resolved   Supervision of other normal pregnancy, antepartum 02/02/2020 by Tresea Mall, CNM No   Overview Addendum 07/12/2020  8:43 AM by Mirna Mires, CNM    Clinic [facility] Prenatal Labs  Dating EDD by 14w u/s Blood type: A/Positive/-- (03/24 1544)   Genetic Screen Inheritest negative     NIPS: diploid XY Antibody:Negative (03/24 1544)  Anatomic [place] Low lying placenta, otherwise normal anatomy, female gender Rubella: 1.49 (03/24 1544) Varicella: Immune  GTT Third trimester:  RPR: Non Reactive (03/24 1544)   Rhogam  not needed HBsAg: Negative (03/24 1544)   TDaP vaccine   04/26/2020   Flu Shot: HIV: Non Reactive (03/24 1544)   Baby Food                                GBS: 07/08/20 negative  Contraception  Pap: NIL/ +HRHPV  CBB     CS/VBAC    Support Person           Previous Version       Term labor symptoms and general obstetric precautions including but not limited to vaginal bleeding, contractions, leaking of fluid and fetal movement were reviewed in detail with the patient. Please refer to After Visit Summary for other counseling recommendations.   Return in about 1 week (around 07/28/2020) for rob.   Covid swab tomorrow morning IOL Monday morning at 8 am  Tresea Mall, [place] 07/21/2020 3:32 PM

## 2020-07-22 ENCOUNTER — Other Ambulatory Visit
Admission: RE | Admit: 2020-07-22 | Discharge: 2020-07-22 | Disposition: A | Discharge status: Home / Self Care | Payer: Medicaid Other | Source: Ambulatory Visit | Attending: Family Medicine | Admitting: Family Medicine

## 2020-07-22 DIAGNOSIS — Z20822 Contact with and (suspected) exposure to covid-19: Secondary | ICD-10-CM | POA: Diagnosis not present

## 2020-07-22 DIAGNOSIS — Z01812 Encounter for preprocedural laboratory examination: Secondary | ICD-10-CM | POA: Insufficient documentation

## 2020-07-22 NOTE — H&P (Signed)
OB History & Physical   History of Present Illness:  Date of initial H&P: 07/22/2020 (office visit 07/21/20)  Chief Complaint: planned elective induction of labor  HPI:  Rebekah Dennis is a 24 y.o. G1P0 female at [redacted]w[redacted]d dated by 14 week ultrasound.  Her pregnancy has been uncomplicated.    She denies contractions.   She denies leakage of fluid.   She denies vaginal bleeding.   She reports fetal movement.    Total weight gain for pregnancy: 45 lb (20.4 kg)   Obstetrical Problem List: pregnancy Problems (from 01/20/20 to present)    Problem Noted Resolved   Supervision of other normal pregnancy, antepartum 02/02/2020 by Tresea Mall, CNM No   Overview Addendum 07/12/2020  8:43 AM by Mirna Mires, CNM    Clinic [facility] Prenatal Labs  Dating EDD by 14w u/s Blood type: A/Positive/-- (03/24 1544)   Genetic Screen Inheritest negative     NIPS: diploid XY Antibody:Negative (03/24 1544)  Anatomic [place] Low lying placenta, otherwise normal anatomy, female gender Rubella: 1.49 (03/24 1544)  Varicella: Immune  GTT Third trimester: 81 RPR: Non Reactive (03/24 1544)   Rhogam  not needed HBsAg: Negative (03/24 1544)   Vaccines TDAP:  04/26/2020 Flu Shot: HIV: Non Reactive (03/24 1544)   Baby Food                                GBS: 07/08/20 negative GC/CT: neg/neg  Contraception  Pap: NIL/ +HRHPV  CBB     CS/VBAC    Support Person           Previous Version       Maternal Medical History:   Past Medical History:  Diagnosis Date  . Chronic dermatitis   . History of UTI     Past Surgical History:  Procedure Laterality Date  . Denies surgical history      Allergies  Allergen Reactions  . Latex     irritation    Prior to Admission medications   Medication Sig Start Date End Date Taking? Authorizing Provider  Prenatal Vit-Fe Phos-FA-Omega (VITAFOL GUMMIES) 3.33-0.333-34.8 MG CHEW Chew 1 tablet by mouth daily. 01/25/20   Vena Austria, MD    OB History  Gravida Para Term  Preterm AB Living  1            SAB TAB Ectopic Multiple Live Births               # Outcome Date GA Lbr Len/2nd Weight Sex Delivery Anes PTL Lv  1 Current             Prenatal care site: [facility]  Social History: She  reports that she quit smoking about 6 months ago. Her smoking use included cigarettes. She has a 2.25 pack-year smoking history. She has never used smokeless tobacco. She reports previous alcohol use. She reports current drug use. Drug: Marijuana.  Family History: family history includes Heart attack in her maternal grandfather and paternal grandmother; Stroke in her paternal grandmother.    Review of Systems:  Review of Systems  Constitutional: Negative for chills and fever.  HENT: Negative for congestion, ear discharge, ear pain, hearing loss, sinus pain and sore throat.   Eyes: Negative for blurred vision and double vision.  Respiratory: Negative for cough, shortness of breath and wheezing.   Cardiovascular: Negative for chest pain, palpitations and leg swelling.  Gastrointestinal: Negative for abdominal pain, blood  in stool, constipation, diarrhea, heartburn, melena, nausea and vomiting.  Genitourinary: Negative for dysuria, flank pain, frequency, hematuria and urgency.  Musculoskeletal: Negative for back pain, joint pain and myalgias.  Skin: Negative for itching and rash.  Neurological: Negative for dizziness, tingling, tremors, sensory change, speech change, focal weakness, seizures, loss of consciousness, weakness and headaches.  Endo/Heme/Allergies: Negative for environmental allergies. Does not bruise/bleed easily.  Psychiatric/Behavioral: Negative for depression, hallucinations, memory loss, substance abuse and suicidal ideas. The patient is not nervous/anxious and does not have insomnia.      Physical Exam:  BP 118/74   Wt 190 lb (86.2 kg)   LMP 08/29/2019 (Within Days)   BMI 28.06 kg/m   Constitutional: Well nourished, well developed female in  no acute distress.  HEENT: normal Skin: Warm and dry.  Cardiovascular: Regular rate and rhythm.   Extremity: no edema  Respiratory: Clear to auscultation bilateral. Normal respiratory effort Abdomen: FHT present Back: no CVAT Neuro: DTRs 2+, Cranial nerves grossly intact Psych: Alert and Oriented x3. No memory deficits. Normal mood and affect.  MS: normal gait, normal bilateral lower extremity ROM/strength/stability.  Pelvic exam:  is not limited by body habitus EGBUS: within normal limits Vagina: within normal limits and with normal mucosa  Cervix: 2.5/60/-2, cervical sweep   Lab Results  Component Value Date   SARSCOV2NAA POSITIVE (A) 02/09/2020   Current Covid test pending  Assessment:  Rebekah Dennis is a 24 y.o. G1P0 female at [redacted]w[redacted]d with elective induction of labor.   Plan:  1. Admit to Labor & Delivery  2. CBC, T&S, Clrs, IVF 3. GBS negative.   4. Fetal well-being: reassuring 5. Start induction per admission exam   Tresea Mall, CNM 07/22/2020 11:40 AM

## 2020-07-22 NOTE — H&P (Deleted)
  The note originally documented on this encounter has been moved the the encounter in which it belongs.  

## 2020-07-23 LAB — SARS CORONAVIRUS 2 (TAT 6-24 HRS): SARS Coronavirus 2: NEGATIVE

## 2020-07-25 ENCOUNTER — Inpatient Hospital Stay: Payer: Medicaid Other | Admitting: Anesthesiology

## 2020-07-25 ENCOUNTER — Inpatient Hospital Stay
Admission: EM | Admit: 2020-07-25 | Discharge: 2020-07-28 | DRG: 806 | Disposition: A | Discharge status: Home / Self Care | Payer: Medicaid Other | Attending: Obstetrics and Gynecology | Admitting: Obstetrics and Gynecology

## 2020-07-25 ENCOUNTER — Other Ambulatory Visit: Payer: Self-pay

## 2020-07-25 ENCOUNTER — Encounter: Payer: Self-pay | Admitting: Advanced Practice Midwife

## 2020-07-25 DIAGNOSIS — Z3A38 38 weeks gestation of pregnancy: Secondary | ICD-10-CM | POA: Diagnosis not present

## 2020-07-25 DIAGNOSIS — Z3A39 39 weeks gestation of pregnancy: Secondary | ICD-10-CM | POA: Diagnosis not present

## 2020-07-25 DIAGNOSIS — O9081 Anemia of the puerperium: Secondary | ICD-10-CM | POA: Diagnosis not present

## 2020-07-25 DIAGNOSIS — Z87891 Personal history of nicotine dependence: Secondary | ICD-10-CM | POA: Diagnosis not present

## 2020-07-25 DIAGNOSIS — D62 Acute posthemorrhagic anemia: Secondary | ICD-10-CM | POA: Diagnosis not present

## 2020-07-25 DIAGNOSIS — Z3490 Encounter for supervision of normal pregnancy, unspecified, unspecified trimester: Secondary | ICD-10-CM

## 2020-07-25 DIAGNOSIS — Z23 Encounter for immunization: Secondary | ICD-10-CM | POA: Diagnosis not present

## 2020-07-25 DIAGNOSIS — O26893 Other specified pregnancy related conditions, third trimester: Secondary | ICD-10-CM | POA: Diagnosis present

## 2020-07-25 DIAGNOSIS — Z3480 Encounter for supervision of other normal pregnancy, unspecified trimester: Secondary | ICD-10-CM

## 2020-07-25 HISTORY — DX: Encounter for supervision of normal pregnancy, unspecified, unspecified trimester: Z34.90

## 2020-07-25 LAB — CBC
HCT: 35 % — ABNORMAL LOW (ref 36.0–46.0)
Hemoglobin: 11.8 g/dL — ABNORMAL LOW (ref 12.0–15.0)
MCH: 30.5 pg (ref 26.0–34.0)
MCHC: 33.7 g/dL (ref 30.0–36.0)
MCV: 90.4 fL (ref 80.0–100.0)
Platelets: 248 10*3/uL (ref 150–400)
RBC: 3.87 MIL/uL (ref 3.87–5.11)
RDW: 12.6 % (ref 11.5–15.5)
WBC: 6.4 10*3/uL (ref 4.0–10.5)
nRBC: 0 % (ref 0.0–0.2)

## 2020-07-25 LAB — TYPE AND SCREEN
ABO/RH(D): A POS
Antibody Screen: NEGATIVE

## 2020-07-25 LAB — URINE DRUG SCREEN, QUALITATIVE (ARMC ONLY)
Amphetamines, Ur Screen: NOT DETECTED
Barbiturates, Ur Screen: NOT DETECTED
Benzodiazepine, Ur Scrn: NOT DETECTED
Cannabinoid 50 Ng, Ur ~~LOC~~: NOT DETECTED
Cocaine Metabolite,Ur ~~LOC~~: NOT DETECTED
MDMA (Ecstasy)Ur Screen: NOT DETECTED
Methadone Scn, Ur: NOT DETECTED
Opiate, Ur Screen: NOT DETECTED
Phencyclidine (PCP) Ur S: NOT DETECTED
Tricyclic, Ur Screen: NOT DETECTED

## 2020-07-25 LAB — ABO/RH: ABO/RH(D): A POS

## 2020-07-25 MED ORDER — BUTORPHANOL TARTRATE 1 MG/ML IJ SOLN: 1.0000 mg | INTRAMUSCULAR | Status: DC | PRN

## 2020-07-25 MED ORDER — LACTATED RINGERS IV SOLN: 500.0000 mL | INTRAVENOUS | Status: DC | PRN

## 2020-07-25 MED ORDER — LIDOCAINE HCL (PF) 1 % IJ SOLN
INTRAMUSCULAR | Status: AC
  Filled 2020-07-25: qty 30

## 2020-07-25 MED ORDER — OXYTOCIN BOLUS FROM INFUSION
333.0000 mL | Freq: Once | INTRAVENOUS | Status: AC
  Administered 2020-07-26: 333 mL via INTRAVENOUS

## 2020-07-25 MED ORDER — OXYTOCIN-SODIUM CHLORIDE 30-0.9 UT/500ML-% IV SOLN
1.0000 m[IU]/min | INTRAVENOUS | Status: DC
  Administered 2020-07-25: 2 m[IU]/min via INTRAVENOUS
  Filled 2020-07-25: qty 1000

## 2020-07-25 MED ORDER — AMMONIA AROMATIC IN INHA
RESPIRATORY_TRACT | Status: AC
  Filled 2020-07-25: qty 10

## 2020-07-25 MED ORDER — OXYTOCIN-SODIUM CHLORIDE 30-0.9 UT/500ML-% IV SOLN
2.5000 [IU]/h | INTRAVENOUS | Status: DC
  Administered 2020-07-26 (×2): 2.5 [IU]/h via INTRAVENOUS

## 2020-07-25 MED ORDER — ONDANSETRON HCL 4 MG/2ML IJ SOLN: 4.0000 mg | Freq: Four times a day (QID) | INTRAMUSCULAR | Status: DC | PRN

## 2020-07-25 MED ORDER — FENTANYL 2.5 MCG/ML W/ROPIVACAINE 0.15% IN NS 100 ML EPIDURAL (ARMC)
EPIDURAL | Status: AC
Start: 2020-07-25 — End: 2020-07-26
  Filled 2020-07-25: qty 100

## 2020-07-25 MED ORDER — LIDOCAINE HCL (PF) 1 % IJ SOLN: 30.0000 mL | INTRAMUSCULAR | Status: DC | PRN

## 2020-07-25 MED ORDER — MISOPROSTOL 100 MCG PO TABS
25.0000 ug | ORAL_TABLET | ORAL | Status: DC | PRN
  Filled 2020-07-25 (×2): qty 1

## 2020-07-25 MED ORDER — OXYTOCIN 10 UNIT/ML IJ SOLN
INTRAMUSCULAR | Status: AC
  Filled 2020-07-25: qty 2

## 2020-07-25 MED ORDER — LACTATED RINGERS IV SOLN: INTRAVENOUS | Status: DC

## 2020-07-25 MED ORDER — TERBUTALINE SULFATE 1 MG/ML IJ SOLN: 0.2500 mg | Freq: Once | INTRAMUSCULAR | Status: DC | PRN

## 2020-07-25 MED ORDER — ACETAMINOPHEN 325 MG PO TABS: 650.0000 mg | ORAL_TABLET | ORAL | Status: DC | PRN

## 2020-07-25 MED ORDER — MISOPROSTOL 200 MCG PO TABS
ORAL_TABLET | ORAL | Status: AC
  Administered 2020-07-25: 25 ug via VAGINAL
  Filled 2020-07-25: qty 4

## 2020-07-25 NOTE — Progress Notes (Signed)
Rebekah Dennis is a 24 y.o. G1P0 at [redacted]w[redacted]d by ultrasound admitted for induction of labor due to Elective at term.  Subjective:she is getting comfortable with an epidural  Objective: BP 130/75   Pulse 73   Temp 97.7 F (36.5 C) (Oral)   Resp 18   Ht 5\' 9"  (1.753 m)   Wt 86.2 kg   LMP 08/29/2019 (Within Days)   SpO2 95%   BMI 28.06 kg/m  I/O last 3 completed shifts: In: 2391 [P.O.:1250; I.V.:1141] Out: -  No intake/output data recorded.  FHT:  FHR: 135-140 baseline bpm, variability: moderate,  accelerations:  Present,  decelerations:  Present period of repetetive variables with contractions noted UC:   regular, every 2-2.5 minutes SVE:   Dilation: 5 Effacement (%): 90 Station: -2 Exam by:: K Allred RN  Labs: Lab Results  Component Value Date   WBC 6.4 07/25/2020   HGB 11.8 (L) 07/25/2020   HCT 35.0 (L) 07/25/2020   MCV 90.4 07/25/2020   PLT 248 07/25/2020    Assessment / Plan: Induction of labor due to term with favorable cervix,  progressing well on pitocin  Labor: Progressing normally Fetal status- category 2 FHTs, some variable decels noted- strip improved with position changes, IV bolus Pain Control:  Epidural I/D:  n/a Anticipated MOD:  NSVD  Plan for cervical exam in several hours. Continue advancing/adjusting the pitocin to achieve UCs q e minutes that are moderate.   07/27/2020 07/25/2020, 10:18 PM

## 2020-07-25 NOTE — Progress Notes (Signed)
Rebekah Dennis is a 24 y.o. G1P0 at [redacted]w[redacted]d by ultrasound admitted for induction of labor due to Elective at term. She received an initial dose of Cytotec, and then pitocin was started. She is uncomfortable but is able to talk through her contractions.  Subjective: She denies need for pain medication at this time. She has used the labor ball, the peanut ball, and ambulated prior to pitocin infusion.   Objective: BP 129/74 (BP Location: Right Arm)   Pulse 74   Temp 98.2 F (36.8 C) (Oral)   Resp 18   Ht 5\' 9"  (1.753 m)   Wt 86.2 kg   LMP 08/29/2019 (Within Days)   BMI 28.06 kg/m  I/O last 3 completed shifts: In: 2391 [P.O.:1250; I.V.:1141] Out: -  No intake/output data recorded.  FHT:  FHR: 135 baseline bpm, variability: moderate,  accelerations:  Present,  decelerations:  Absent UC:   regular, every 2.5 minutes SVE:   Dilation: 4 Effacement (%): 80 Station: -2, -3 Exam by:: [email or web address] CNM  Pitocin is on 10 mu/min Labs: Lab Results  Component Value Date   WBC 6.4 07/25/2020   HGB 11.8 (L) 07/25/2020   HCT 35.0 (L) 07/25/2020   MCV 90.4 07/25/2020   PLT 248 07/25/2020    Assessment / Plan: Induction of labor due to term with favorable cervix,  progressing well on pitocin  Labor: has made some progress on pitocin.  AROM at 1945 approximately- clear fluid seen  Fetal Wellbeing:  Category I Pain Control:  Labor support without medications I/D:  n/a Anticipated MOD:  NSVD  07/27/2020 07/25/2020, 7:53 PM

## 2020-07-25 NOTE — Progress Notes (Signed)
Pt presents to L&D for scheduled IOL. Pt denies ctx, LOF, or VB. Orders released. M.Eunice Blase, CNM aware of pt.

## 2020-07-25 NOTE — Progress Notes (Signed)
Rebekah Dennis is a 24 y.o. G1P0 at [redacted]w[redacted]d by ultrasound admitted for induction of labor due to Elective at term.  Subjective: she denies feeling contractions this morning. Denies any LOF, vaginal bleeding. Her baby is moving well. She has no headache or visual changes. [place] plans on an epidural for labor. Her mother is present and supportive.  Objective: BP 129/82 (BP Location: Left Arm)   Pulse 100   Temp 98.5 F (36.9 C) (Oral)   Resp 18   Ht 5\' 9"  (1.753 m)   Wt 86.2 kg   LMP 08/29/2019 (Within Days)   BMI 28.06 kg/m  No intake/output data recorded. No intake/output data recorded.  FHT:  FHR: 135 baseline bpm, variability: moderate,  accelerations:  Present,  decelerations:  Absent UC:   Rare, mild SVE:   Dilation: 3 Effacement (%): 60 Station: -3 Exam by:: M.Kapena Hamme, CNM  Bishop score: 5  Labs: Lab Results  Component Value Date   WBC 6.4 07/25/2020   HGB 11.8 (L) 07/25/2020   HCT 35.0 (L) 07/25/2020   MCV 90.4 07/25/2020   PLT 248 07/25/2020    Assessment / Plan: Induction of labor due to term with favorable cervix,  progressing well on pitocin  Labor: Will commence with Cytotec for cervical ripening.  Fetal Wellbeing:  Category I Pain Control:  Epidural planned I/D:  n/a Anticipated MOD:  NSVD   Process of induction explained to patient , who consents to the use of Cytotec and later, pitocin.\ Will recheck in several hours or PRN.  07/27/2020, CNM  07/25/2020 9:26 AM    07/27/2020 Rebekah Dennis 07/25/2020, 9:17 AM

## 2020-07-25 NOTE — Progress Notes (Signed)
Rebekah Dennis is a 24 y.o. G1P0 at [redacted]w[redacted]d by ultrasound admitted for induction of labor due to Elective at term.   Subjective: she denies contractions pain. Does feel her belly tighten. Denies any vaginal bleeding or LOF.   Objective: BP 127/83 (BP Location: Left Arm)   Pulse 75   Temp 98.2 F (36.8 C) (Oral)   Resp 18   Ht 5\' 9"  (1.753 m)   Wt 86.2 kg   LMP 08/29/2019 (Within Days)   BMI 28.06 kg/m  No intake/output data recorded. Total I/O In: 1445.2 [P.O.:750; I.V.:695.2] Out: -   FHT:  FHR: 135 baseline bpm, variability: moderate,  accelerations:  Present,  decelerations:  Absent UC:   regular, every 2-4 minutes, mild SVE:   Dilation: 3.5 Effacement (%): 70 Station: -3 Exam by:: [email or web address], CNM  Bishop score:  6  Labs: Lab Results  Component Value Date   WBC 6.4 07/25/2020   HGB 11.8 (L) 07/25/2020   HCT 35.0 (L) 07/25/2020   MCV 90.4 07/25/2020   PLT 248 07/25/2020    Assessment / Plan: Induction of labor due to term with favorable cervix,  progressing well on pitocin  Labor: Progressing normally and will now proceed with pitocin infusion pre protocol  Fetal Wellbeing:  Category I Pain Control:  Labor support without medications I/D:  n/a Anticipated MOD:  NSVD  07/27/2020 07/25/2020, 2:53 PM

## 2020-07-25 NOTE — Anesthesia Preprocedure Evaluation (Signed)
Anesthesia Evaluation  Patient identified by MRN, date of birth, ID band Patient awake    Reviewed: Allergy & Precautions, NPO status , Patient's Chart, lab work & pertinent test results  Airway Mallampati: II  TM Distance: >3 FB     Dental   Pulmonary former smoker,    Pulmonary exam normal        Cardiovascular negative cardio ROS Normal cardiovascular exam     Neuro/Psych negative neurological ROS  negative psych ROS   GI/Hepatic negative GI ROS, Neg liver ROS,   Endo/Other  negative endocrine ROS  Renal/GU negative Renal ROS  negative genitourinary   Musculoskeletal negative musculoskeletal ROS (+)   Abdominal Normal abdominal exam  (+)   Peds negative pediatric ROS (+)  Hematology negative hematology ROS (+)   Anesthesia Other Findings Past Medical History: No date: Chronic dermatitis No date: History of UTI  Reproductive/Obstetrics                             Anesthesia Physical Anesthesia Plan  ASA: II  Anesthesia Plan: Epidural   Post-op Pain Management:    Induction:   PONV Risk Score and Plan:   Airway Management Planned: Natural Airway  Additional Equipment:   Intra-op Plan:   Post-operative Plan:   Informed Consent: I have reviewed the patients History and Physical, chart, labs and discussed the procedure including the risks, benefits and alternatives for the proposed anesthesia with the patient or authorized representative who has indicated his/her understanding and acceptance.     Dental advisory given  Plan Discussed with: CRNA and Surgeon  Anesthesia Plan Comments:         Anesthesia Quick Evaluation

## 2020-07-25 NOTE — Progress Notes (Signed)
Rebekah Dennis is a 24 y.o. G1P0 at [redacted]w[redacted]d by ultrasound admitted for induction of labor due to Elective at term.  Subjective: she is now feeling contractions, but remains quiet and without any distress throughout.  Objective: BP (!) 140/92 (BP Location: Right Arm)   Pulse 76   Temp 98.2 F (36.8 C) (Oral)   Resp 18   Ht 5\' 9"  (1.753 m)   Wt 86.2 kg   LMP 08/29/2019 (Within Days)   BMI 28.06 kg/m  No intake/output data recorded. Total I/O In: 1517.4 [P.O.:750; I.V.:767.4] Out: -   FHT:  FHR: 135 baseline bpm, variability: moderate,  accelerations:  Present,  decelerations:  Absent UC:   regular, every 2  minutes, mild to moderate to palpation SVE:   Dilation: 4 Effacement (%): 80 Station: -3 Exam by:: [email or web address], CNM  Labs: Lab Results  Component Value Date   WBC 6.4 07/25/2020   HGB 11.8 (L) 07/25/2020   HCT 35.0 (L) 07/25/2020   MCV 90.4 07/25/2020   PLT 248 07/25/2020    Assessment / Plan: Induction of labor due to term with favorable cervix,  progressing well on pitocin  Labor: Progressing on Pitocin, will continue to increase then AROM  Fetal Wellbeing:  Category I Pain Control:  Epidural  Is planned. I/D:  n/a Anticipated MOD:  NSVD  07/27/2020 07/25/2020, 4:17 PM

## 2020-07-26 ENCOUNTER — Encounter: Payer: Self-pay | Admitting: Advanced Practice Midwife

## 2020-07-26 DIAGNOSIS — Z3A39 39 weeks gestation of pregnancy: Secondary | ICD-10-CM

## 2020-07-26 LAB — COMPREHENSIVE METABOLIC PANEL
ALT: 17 U/L (ref 0–44)
AST: 37 U/L (ref 15–41)
Albumin: 2.7 g/dL — ABNORMAL LOW (ref 3.5–5.0)
Alkaline Phosphatase: 127 U/L — ABNORMAL HIGH (ref 38–126)
Anion gap: 8 (ref 5–15)
BUN: 5 mg/dL — ABNORMAL LOW (ref 6–20)
CO2: 22 mmol/L (ref 22–32)
Calcium: 8.8 mg/dL — ABNORMAL LOW (ref 8.9–10.3)
Chloride: 106 mmol/L (ref 98–111)
Creatinine, Ser: 0.73 mg/dL (ref 0.44–1.00)
GFR calc Af Amer: >60 mL/min (ref >60)
GFR calc non Af Amer: >60 mL/min (ref >60)
Glucose, Bld: 117 mg/dL — ABNORMAL HIGH (ref 70–99)
Potassium: 3.8 mmol/L (ref 3.5–5.1)
Sodium: 136 mmol/L (ref 135–145)
Total Bilirubin: 0.8 mg/dL (ref 0.3–1.2)
Total Protein: 5.7 g/dL — ABNORMAL LOW (ref 6.5–8.1)

## 2020-07-26 LAB — CBC WITH DIFFERENTIAL/PLATELET
Abs Immature Granulocytes: 0.16 10*3/uL — ABNORMAL HIGH (ref 0.00–0.07)
Basophils Absolute: 0.1 10*3/uL (ref 0.0–0.1)
Basophils Relative: 0 %
Eosinophils Absolute: 0.1 10*3/uL (ref 0.0–0.5)
Eosinophils Relative: 0 %
HCT: 32.2 % — ABNORMAL LOW (ref 36.0–46.0)
Hemoglobin: 11.1 g/dL — ABNORMAL LOW (ref 12.0–15.0)
Immature Granulocytes: 1 %
Lymphocytes Relative: 9 %
Lymphs Abs: 1.8 10*3/uL (ref 0.7–4.0)
MCH: 30.4 pg (ref 26.0–34.0)
MCHC: 34.5 g/dL (ref 30.0–36.0)
MCV: 88.2 fL (ref 80.0–100.0)
Monocytes Absolute: 2.2 10*3/uL — ABNORMAL HIGH (ref 0.1–1.0)
Monocytes Relative: 12 %
Neutro Abs: 15 10*3/uL — ABNORMAL HIGH (ref 1.7–7.7)
Neutrophils Relative %: 78 %
Platelets: 242 10*3/uL (ref 150–400)
RBC: 3.65 MIL/uL — ABNORMAL LOW (ref 3.87–5.11)
RDW: 12.4 % (ref 11.5–15.5)
WBC: 19.3 10*3/uL — ABNORMAL HIGH (ref 4.0–10.5)
nRBC: 0 % (ref 0.0–0.2)

## 2020-07-26 LAB — RPR: RPR Ser Ql: NONREACTIVE

## 2020-07-26 LAB — PROTEIN / CREATININE RATIO, URINE
Creatinine, Urine: 27 mg/dL
Protein Creatinine Ratio: 1.52 mg/mg{Cre} — ABNORMAL HIGH (ref 0.00–0.15)
Total Protein, Urine: 41 mg/dL

## 2020-07-26 MED ORDER — ACETAMINOPHEN 325 MG PO TABS
650.0000 mg | ORAL_TABLET | ORAL | Status: DC | PRN
  Administered 2020-07-26 (×2): 650 mg via ORAL
  Filled 2020-07-26 (×2): qty 2

## 2020-07-26 MED ORDER — OXYCODONE HCL 5 MG PO TABS: 10.0000 mg | ORAL_TABLET | ORAL | Status: DC | PRN

## 2020-07-26 MED ORDER — BENZOCAINE-MENTHOL 20-0.5 % EX AERO
1.0000 "application " | INHALATION_SPRAY | CUTANEOUS | Status: DC | PRN
  Administered 2020-07-26: 1 via TOPICAL
  Filled 2020-07-26: qty 56

## 2020-07-26 MED ORDER — FENTANYL 2.5 MCG/ML W/ROPIVACAINE 0.15% IN NS 100 ML EPIDURAL (ARMC)
EPIDURAL | Status: AC
  Filled 2020-07-26: qty 100

## 2020-07-26 MED ORDER — DOCUSATE SODIUM 100 MG PO CAPS
100.0000 mg | ORAL_CAPSULE | Freq: Two times a day (BID) | ORAL | Status: DC
  Administered 2020-07-27 – 2020-07-28 (×3): 100 mg via ORAL
  Filled 2020-07-26 (×4): qty 1

## 2020-07-26 MED ORDER — FENTANYL 2.5 MCG/ML W/ROPIVACAINE 0.15% IN NS 100 ML EPIDURAL (ARMC)
EPIDURAL | Status: DC | PRN
Start: 2020-07-25 — End: 2020-07-26
  Administered 2020-07-25: 12 mL/h via EPIDURAL

## 2020-07-26 MED ORDER — ONDANSETRON HCL 4 MG PO TABS: 4.0000 mg | ORAL_TABLET | ORAL | Status: DC | PRN

## 2020-07-26 MED ORDER — LIDOCAINE-EPINEPHRINE (PF) 1.5 %-1:200000 IJ SOLN
INTRAMUSCULAR | Status: DC | PRN
  Administered 2020-07-25: 3 mL via PERINEURAL

## 2020-07-26 MED ORDER — COCONUT OIL OIL
1.0000 "application " | TOPICAL_OIL | Status: DC | PRN
  Administered 2020-07-26: 1 via TOPICAL
  Filled 2020-07-26: qty 120

## 2020-07-26 MED ORDER — PRENATAL MULTIVITAMIN CH
1.0000 | ORAL_TABLET | Freq: Every day | ORAL | Status: DC
  Administered 2020-07-26 – 2020-07-28 (×3): 1 via ORAL
  Filled 2020-07-26 (×3): qty 1

## 2020-07-26 MED ORDER — LIDOCAINE HCL (PF) 1 % IJ SOLN
INTRAMUSCULAR | Status: DC | PRN
  Administered 2020-07-25: 3 mL

## 2020-07-26 MED ORDER — DIBUCAINE (PERIANAL) 1 % EX OINT: 1.0000 "application " | TOPICAL_OINTMENT | CUTANEOUS | Status: DC | PRN

## 2020-07-26 MED ORDER — ONDANSETRON HCL 4 MG/2ML IJ SOLN: 4.0000 mg | INTRAMUSCULAR | Status: DC | PRN

## 2020-07-26 MED ORDER — IBUPROFEN 600 MG PO TABS
600.0000 mg | ORAL_TABLET | Freq: Four times a day (QID) | ORAL | Status: DC
  Administered 2020-07-26 – 2020-07-28 (×9): 600 mg via ORAL
  Filled 2020-07-26 (×9): qty 1

## 2020-07-26 MED ORDER — WITCH HAZEL-GLYCERIN EX PADS: 1.0000 "application " | MEDICATED_PAD | CUTANEOUS | Status: DC | PRN

## 2020-07-26 MED ORDER — BUPIVACAINE HCL (PF) 0.25 % IJ SOLN
INTRAMUSCULAR | Status: DC | PRN
  Administered 2020-07-25: 7 mL via EPIDURAL

## 2020-07-26 MED ORDER — DIPHENHYDRAMINE HCL 25 MG PO CAPS: 25.0000 mg | ORAL_CAPSULE | Freq: Four times a day (QID) | ORAL | Status: DC | PRN

## 2020-07-26 MED ORDER — SIMETHICONE 80 MG PO CHEW: 80.0000 mg | CHEWABLE_TABLET | ORAL | Status: DC | PRN

## 2020-07-26 MED ORDER — ZOLPIDEM TARTRATE 5 MG PO TABS: 5.0000 mg | ORAL_TABLET | Freq: Every evening | ORAL | Status: DC | PRN

## 2020-07-26 MED ORDER — INFLUENZA VAC SPLIT QUAD 0.5 ML IM SUSY
0.5000 mL | PREFILLED_SYRINGE | INTRAMUSCULAR | Status: AC
  Administered 2020-07-27: 0.5 mL via INTRAMUSCULAR
  Filled 2020-07-26: qty 0.5

## 2020-07-26 MED ORDER — OXYCODONE HCL 5 MG PO TABS: 5.0000 mg | ORAL_TABLET | ORAL | Status: DC | PRN

## 2020-07-26 NOTE — Progress Notes (Signed)
Infant is showing little interest in feeding. Pt set up to pump. Pt educated on how to set up pump, clean parts, and to pump at least every 3-4 hours to keep supply up. Pt demonstrates and verbalizes understanding. No further questions at this time.

## 2020-07-26 NOTE — Lactation Note (Signed)
This note was copied from a baby's chart. Lactation Consultation Note  Patient Name: Rebekah Dennis GURKY'H Date: 07/26/2020 Reason for consult: Follow-up assessment;1st time breastfeeding;Term  G1P1 mom delivered SVD 9hrs ago. Multiple attempts made for feeding at the breast; baby sleepy and uninterested. Oral assessment not completed due to baby not interested in accepting digit.   LC and LC student made 2 separate attempts to assist mom with putting baby to the breast. LC taught and demonstrated hand expression, small drops were visible after some time. Mom has erect nipples, larger breasts, but easily compressible. Baby placed in football hold nose to nipple tummy turned in towards mom's right breast. Baby remained a sleep and did not open mouth at all. Baby moved skin to skin on mom's chest.  Mom was educated on newborn stomach size, feeding behaviors, early cues, benefits of skin to skin, and output expectations. Encouraged mom to rest for now, baby may be awake often throughout the night to feed. LC did not evidence of clear fluid inside mouth (bubbles and some spitting), which may be affecting his appetite. Encouraged continued frequent attempts, remain skin to skin as much as possible, and seek assistance with next feeding attempt.  Maternal Data Formula Feeding for Exclusion: No Has patient been taught Hand Expression?: Yes Does the patient have breastfeeding experience prior to this delivery?: No  Feeding Feeding Type: Breast Fed  LATCH Score                   Interventions Interventions: Breast feeding basics reviewed;Assisted with latch;Hand express;Adjust position;Support pillows;Position options  Lactation Tools Discussed/Used     Consult Status Consult Status: Follow-up Date: 07/26/20 Follow-up type: In-patient    Danford Bad 07/26/2020, 3:17 PM

## 2020-07-26 NOTE — Progress Notes (Signed)
Rebekah Dennis is a 24 y.o. G1P0 at [redacted]w[redacted]d by ultrasound admitted for induction of labor due to Elective at term.  Subjective:PTient has been sleeping very comfortably. Denies any urge to push, or rectal pressure.  Objective: BP 125/79   Pulse 77   Temp (!) 97.3 F (36.3 C) (Oral)   Resp 18   Ht 5\' 9"  (1.753 m)   Wt 86.2 kg   LMP 08/29/2019 (Within Days)   SpO2 100%   BMI 28.06 kg/m  I/O last 3 completed shifts: In: 2391 [P.O.:1250; I.V.:1141] Out: -  Total I/O In: 1040.7 [I.V.:1040.7] Out: 350 [Urine:350]  FHT:  FHR: 130 bpm, variability: moderate,  accelerations:  Present,  decelerations:  Present occasional variable with ra;pidreturn to baseline UC:   regular, every 1.5-2 minutes minutes SVE:   Dilation: 10 Effacement (%): 100 Station: -1 Exam by:: [email or web address] CNM  Labs: Lab Results  Component Value Date   WBC 6.4 07/25/2020   HGB 11.8 (L) 07/25/2020   HCT 35.0 (L) 07/25/2020   MCV 90.4 07/25/2020   PLT 248 07/25/2020    Assessment / Plan: complete dilation, , tachysystole, reassuring FHTs  No urge to bear down.  Labor: Progressing normally  Fetal Wellbeing:  Category II Pain Control:  Epidural I/D:  n/a Anticipated MOD:  NSVD  Will allow a period to "rest and descend" Reduce pitocin to achieve UCs q 3 minutes pattern. Commence in approximately one hour.   07/27/2020 07/26/2020, 2:32 AM

## 2020-07-26 NOTE — Anesthesia Procedure Notes (Signed)
Epidural Patient location during procedure: OB Start time: 07/25/2020 8:23 PM End time: 07/25/2020 8:36 PM  Staffing Anesthesiologist: Yves Dill, MD Performed: anesthesiologist   Preanesthetic Checklist Completed: patient identified, IV checked, site marked, risks and benefits discussed, surgical consent, monitors and equipment checked, pre-op evaluation and timeout performed  Epidural Patient position: sitting Prep: Betadine Patient monitoring: heart rate, continuous pulse ox and blood pressure Approach: midline Location: L3-L4 Injection technique: LOR air  Needle:  Needle type: Tuohy  Needle gauge: 17 G Needle length: 9 cm and 9 Catheter type: closed end flexible Catheter size: 19 Gauge Test dose: negative and 1.5% lidocaine with Epi 1:200 K  Assessment Events: blood not aspirated, injection not painful, no injection resistance, no paresthesia and negative IV test  Additional Notes Time out called. Patient placed in sitting position.  Back prepped and draped in sterile fashion and epidural performed as above.  Easy X1.  Patient tolerated the procedure well.  The catheter was threaded 3 cm and the TD was negative.  The catheter was affixed to the back in sterile fashion.Reason for block:procedure for pain

## 2020-07-26 NOTE — Progress Notes (Signed)
Rebekah Dennis is a 24 y.o. G1P0 at [redacted]w[redacted]d by ultrasound admitted for induction of labor due to Elective at term.  Subjective: Pt bagan pushing after a period of "rest and descent" at approximately 0340.  Feeling rectal pressure, and c/o feeling very tired.  Objective: BP 128/88   Pulse 84   Temp (!) 97.3 F (36.3 C) (Oral)   Resp 18   Ht 5\' 9"  (1.753 m)   Wt 86.2 kg   LMP 08/29/2019 (Within Days)   SpO2 97%   BMI 28.06 kg/m  I/O last 3 completed shifts: In: 2391 [P.O.:1250; I.V.:1141] Out: -  Total I/O In: 1600.3 [I.V.:1600.3] Out: 1075 [Urine:1075]  FHT:  FHR: 145 baseline bpm, variability: moderate,  accelerations:  Present,  decelerations:  Present variables noted with pushing, some with late componant, with good return to baselin UC:   regular, every 2 minutes SVE:   Dilation: 10 Effacement (%): 100 Station: -1 Exam by:: [email or web address] CNM    Labs: Lab Results  Component Value Date   WBC 6.4 07/25/2020   HGB 11.8 (L) 07/25/2020   HCT 35.0 (L) 07/25/2020   MCV 90.4 07/25/2020   PLT 248 07/25/2020    Assessment / Plan: Second stage labor with slow descent. Initailly , the head was OP; now rotated to OA.  Labor: Continuing to push well, although patint is fatigued.  Fetal Wellbeing:  Category II Pain Control:  Epidural I/D:  n/a Anticipated MOD:  NSVD  Will continue pushing another 30 minutes, then re evaluate for any need for assistance.  07/27/2020 07/26/2020, 5:28 AM

## 2020-07-27 LAB — CBC
HCT: 27.7 % — ABNORMAL LOW (ref 36.0–46.0)
Hemoglobin: 9.5 g/dL — ABNORMAL LOW (ref 12.0–15.0)
MCH: 30.7 pg (ref 26.0–34.0)
MCHC: 34.3 g/dL (ref 30.0–36.0)
MCV: 89.6 fL (ref 80.0–100.0)
Platelets: 206 10*3/uL (ref 150–400)
RBC: 3.09 MIL/uL — ABNORMAL LOW (ref 3.87–5.11)
RDW: 12.7 % (ref 11.5–15.5)
WBC: 16.6 10*3/uL — ABNORMAL HIGH (ref 4.0–10.5)
nRBC: 0 % (ref 0.0–0.2)

## 2020-07-27 NOTE — Anesthesia Postprocedure Evaluation (Signed)
Anesthesia Post Note  Patient: Rebekah Dennis  Procedure(s) Performed: AN AD HOC LABOR EPIDURAL  Patient location during evaluation: Mother Baby Anesthesia Type: Epidural Level of consciousness: awake and alert Pain management: pain level controlled Vital Signs Assessment: post-procedure vital signs reviewed and stable Respiratory status: spontaneous breathing, nonlabored ventilation and respiratory function stable Cardiovascular status: stable Postop Assessment: no headache, no backache and epidural receding Anesthetic complications: no   No complications documented.   Last Vitals:  Vitals:   07/26/20 2334 07/27/20 0416  BP: 119/85 117/72  Pulse: 81 69  Resp: 20 20  Temp: 36.9 C 36.7 C  SpO2: 100% 99%    Last Pain:  Vitals:   07/27/20 0636  TempSrc:   PainSc: 0-No pain                 Kani Chauvin B Alonza Smoker

## 2020-07-27 NOTE — Lactation Note (Signed)
This note was copied from a baby's chart. Lactation Consultation Note  Patient Name: Rebekah Dennis [id number] Date: 07/27/2020    Lactation check-in: baby fed frequently overnight after having a large spit-up late yesterday evening. Mom reports feeling tugging and hearing swallows; no pain discomfort. Void/stools exceed expectations for HOL. Baby was circumcised early this morning, and is resting comfortably. LC reviewed impact circ may have on feedings for the next few hours, encouraged mom to rest. LC name/number on whiteboard, mom instructed to call with next feeding attempt.    Danford Bad 07/27/2020, 9:49 AM

## 2020-07-27 NOTE — Lactation Note (Signed)
This note was copied from a baby's chart. Lactation Consultation Note  Patient Name: Rebekah Dennis [id number] Date: 07/27/2020 Reason for consult: Follow-up assessment;1st time breastfeeding;Term  Lactation check-in. First feeding attempt post circ. Baby was alert, and latched easily at the breast, a few suck/swallows then released breast. Nipple appeared nice and round when coming off, and mom has no pain or discomfort.  LC explained feeding behaviors and patterns following circumcision, encouraged continued frequent attempts and skin to skin for comfort, bonding, and milk supply. Encouraged to call out at next attempt.  Maternal Data Formula Feeding for Exclusion: No Has patient been taught Hand Expression?: Yes Does the patient have breastfeeding experience prior to this delivery?: No  Feeding Feeding Type: Breast Fed  LATCH Score Latch: Grasps breast easily, tongue down, lips flanged, rhythmical sucking.  Audible Swallowing: A few with stimulation  Type of Nipple: Everted at rest and after stimulation  Comfort (Breast/Nipple): Soft / non-tender  Hold (Positioning): No assistance needed to correctly position infant at breast. (recommended pillow support)  LATCH Score: 9  Interventions Interventions: Breast feeding basics reviewed;Support pillows  Lactation Tools Discussed/Used Initiated by:: RN Date initiated:: 07/26/20   Consult Status Consult Status: PRN Date: 07/27/20 Follow-up type: Call as needed    Danford Bad 07/27/2020, 11:26 AM

## 2020-07-27 NOTE — Progress Notes (Signed)
Subjective:  Doing well postpartum day 1: She is tolerating regular diet. Her pain is controlled with PO medication. She is ambulating and voiding without difficulty. She reports breastfeeding is going well.  Objective:  Vital signs in last 24 hours: Temp:  [98 F (36.7 C)-98.5 F (36.9 C)] 98.5 F (36.9 C) (09/29 0839) Pulse Rate:  [62-83] 82 (09/29 0839) Resp:  [20] 20 (09/29 0839) BP: (114-126)/(70-85) 121/74 (09/29 0839) SpO2:  [99 %-100 %] 99 % (09/29 0839)    General: NAD Pulmonary: no increased work of breathing Abdomen: non-distended, non-tender, fundus firm at level of umbilicus Extremities: no edema, no erythema, no tenderness  Results for orders placed or performed during the hospital encounter of 07/25/20 (from the past 72 hour(s))  CBC     Status: Abnormal   Collection Time: 07/25/20  8:50 AM  Result Value Ref Range   WBC 6.4 4.0 - 10.5 K/uL   RBC 3.87 3.87 - 5.11 MIL/uL   Hemoglobin 11.8 (L) 12.0 - 15.0 g/dL   HCT 66.2 (L) 36 - 46 %   MCV 90.4 80.0 - 100.0 fL   MCH 30.5 26.0 - 34.0 pg   MCHC 33.7 30.0 - 36.0 g/dL   RDW 94.7 65.4 - 65.0 %   Platelets 248 150 - 400 K/uL   nRBC 0.0 0.0 - 0.2 %    Comment: Performed at [facility], [address] [place], [place] [address]  RPR     Status: None   Collection Time: 07/25/20  8:50 AM  Result Value Ref Range   RPR Ser Ql NON REACTIVE NON REACTIVE    Comment: Performed at [facility], [address], [place], [place] [address]  Type and screen     Status: None   Collection Time: 07/25/20  8:50 AM  Result Value Ref Range   ABO/RH(D) A POS    Antibody Screen NEG    Sample Expiration      07/28/2020,2359 Performed at [facility], [address] [place], [place] [address]   ABO/Rh     Status: None   Collection Time: 07/25/20  9:56 AM  Result Value Ref Range   ABO/RH(D)      A POS Performed at [facility], [address] [place], [place] [address]   Urine Drug  Screen, Qualitative ([facility] only)     Status: None   Collection Time: 07/25/20  5:58 PM  Result Value Ref Range   Tricyclic, Ur Screen NONE DETECTED NONE DETECTED   Amphetamines, Ur Screen NONE DETECTED NONE DETECTED   MDMA (Ecstasy)Ur Screen NONE DETECTED NONE DETECTED   Cocaine Metabolite,Ur [place] NONE DETECTED NONE DETECTED   Opiate, Ur Screen NONE DETECTED NONE DETECTED   Phencyclidine (PCP) Ur S NONE DETECTED NONE DETECTED   Cannabinoid 50 Ng, Ur [place] NONE DETECTED NONE DETECTED   Barbiturates, Ur Screen NONE DETECTED NONE DETECTED   Benzodiazepine, Ur Scrn NONE DETECTED NONE DETECTED   Methadone Scn, Ur NONE DETECTED NONE DETECTED    Comment: (NOTE) Tricyclics + metabolites, urine    Cutoff 1000 ng/mL Amphetamines + metabolites, urine  Cutoff 1000 ng/mL MDMA (Ecstasy), urine              Cutoff 500 ng/mL Cocaine Metabolite, urine          Cutoff 300 ng/mL Opiate + metabolites, urine        Cutoff 300 ng/mL Phencyclidine (PCP), urine         Cutoff  25 ng/mL Cannabinoid, urine                 Cutoff 50 ng/mL Barbiturates + metabolites, urine  Cutoff 200 ng/mL Benzodiazepine, urine              Cutoff 200 ng/mL Methadone, urine                   Cutoff 300 ng/mL  The urine drug screen provides only a preliminary, unconfirmed analytical test result and should not be used for non-medical purposes. Clinical consideration and professional judgment should be applied to any positive drug screen result due to possible interfering substances. A more specific alternate chemical method must be used in order to obtain a confirmed analytical result. Gas chromatography / mass spectrometry (GC/MS) is the preferred confirm atory method. Performed at [facility], [address] [place], [place] [address]   Protein / creatinine ratio, urine     Status: Abnormal   Collection Time: 07/26/20 11:58 AM  Result Value Ref Range   Creatinine, Urine 27 mg/dL   Total Protein, Urine 41 mg/dL     Comment: NO NORMAL RANGE ESTABLISHED FOR THIS TEST   Protein Creatinine Ratio 1.52 (H) 0.00 - 0.15 mg/mg[Cre]    Comment: Performed at [facility], [address] [place], [place] [address]  CBC with Differential/Platelet     Status: Abnormal   Collection Time: 07/26/20  1:23 PM  Result Value Ref Range   WBC 19.3 (H) 4.0 - 10.5 K/uL   RBC 3.65 (L) 3.87 - 5.11 MIL/uL   Hemoglobin 11.1 (L) 12.0 - 15.0 g/dL   HCT 03.1 (L) 36 - 46 %   MCV 88.2 80.0 - 100.0 fL   MCH 30.4 26.0 - 34.0 pg   MCHC 34.5 30.0 - 36.0 g/dL   RDW 59.4 58.5 - 92.9 %   Platelets 242 150 - 400 K/uL   nRBC 0.0 0.0 - 0.2 %   Neutrophils Relative % 78 %   Neutro Abs 15.0 (H) 1.7 - 7.7 K/uL   Lymphocytes Relative 9 %   Lymphs Abs 1.8 0.7 - 4.0 K/uL   Monocytes Relative 12 %   Monocytes Absolute 2.2 (H) 0 - 1 K/uL   Eosinophils Relative 0 %   Eosinophils Absolute 0.1 0 - 0 K/uL   Basophils Relative 0 %   Basophils Absolute 0.1 0 - 0 K/uL   Immature Granulocytes 1 %   Abs Immature Granulocytes 0.16 (H) 0.00 - 0.07 K/uL    Comment: Performed at [facility], [address] [place], [place] [address]  Comprehensive metabolic panel     Status: Abnormal   Collection Time: 07/26/20  1:23 PM  Result Value Ref Range   Sodium 136 135 - 145 mmol/L   Potassium 3.8 3.5 - 5.1 mmol/L   Chloride 106 98 - 111 mmol/L   CO2 22 22 - 32 mmol/L   Glucose, Bld 117 (H) 70 - 99 mg/dL    Comment: Glucose reference range applies only to samples taken after fasting for at least 8 hours.   BUN 5 (L) 6 - 20 mg/dL   Creatinine, Ser 8.63 0.44 - 1.00 mg/dL   Calcium 8.8 (L) 8.9 - 10.3 mg/dL   Total Protein 5.7 (L) 6.5 - 8.1 g/dL   Albumin 2.7 (L) 3.5 - 5.0 g/dL   AST 37 15 - 41 U/L   ALT 17 0 - 44 U/L   Alkaline Phosphatase 127 (H)  38 - 126 U/L   Total Bilirubin 0.8 0.3 - 1.2 mg/dL   GFR calc non Af Amer >60 >60 mL/min   GFR calc Af Amer >60 >60 mL/min   Anion gap 8 5 - 15    Comment: Performed at [facility], [address] [place], [place] [address]  CBC     Status: Abnormal   Collection Time: 07/27/20  5:57 AM  Result Value Ref Range   WBC 16.6 (H) 4.0 - 10.5 K/uL   RBC 3.09 (L) 3.87 - 5.11 MIL/uL   Hemoglobin 9.5 (L) 12.0 - 15.0 g/dL   HCT 60.4 (L) 36 - 46 %   MCV 89.6 80.0 - 100.0 fL   MCH 30.7 26.0 - 34.0 pg   MCHC 34.3 30.0 - 36.0 g/dL   RDW 54.0 98.1 - 19.1 %   Platelets 206 150 - 400 K/uL   nRBC 0.0 0.0 - 0.2 %    Comment: Performed at [facility], [address] [place], [place] [address]    Assessment:   24 y.o. G1P1001 postpartum day # 1, lactating  Plan:    1) Acute blood loss anemia - hemodynamically stable and asymptomatic - po ferrous sulfate  2) Blood Type --/--/A POS Performed at [facility], [address] [place], [place] [address]  [phone]) / Ishmael Holter 1.49 (03/24 1544) / Varicella Immune  3) TDAP status up to date  4) Feeding plan breast  5)  Education given regarding options for contraception, as well as compatibility with breast feeding if applicable.  Patient plans on Depo-Provera injections for contraception.  6) Disposition: continue current care   Tresea Mall, CNM [facility] 07/27/2020, 11:38 AM

## 2020-07-27 NOTE — Discharge Summary (Signed)
Postpartum Discharge Summary  Date of Service updated 07/28/2020     Patient Name: Rebekah Dennis DOB: [birth year] MRN: [id number]  Date of admission: 07/25/2020 Delivery date:07/26/2020  Delivering provider: Imagene Riches  Date of discharge: 07/28/2020  Admitting diagnosis: Encounter for planned induction of labor [Z34.90] Intrauterine pregnancy: [redacted]w[redacted]d    Secondary diagnosis:  Active Problems:   Encounter for planned induction of labor   Postpartum care following vaginal delivery   Normal labor and delivery  Additional problems: none    Discharge diagnosis: Term Pregnancy Delivered                                              Post partum procedures:none Augmentation: AROM and Pitocin Complications: None  Hospital course: Induction of Labor With Vaginal Delivery   24y.o. yo G1P1001 at 352w1das admitted to the hospital 07/25/2020 for induction of labor.  Indication for induction: Elective.  Patient had an uncomplicated labor course as follows: Membrane Rupture Time/Date: 7:33 PM ,07/25/2020   Delivery Method:Vaginal, Spontaneous  Episiotomy: None  Lacerations:  1st degree;Perineal  Details of delivery can be found in separate delivery note.  Patient had a routine postpartum course. Patient is discharged home 07/28/20.  Newborn Data: Birth date:07/26/2020  Birth time:6:01 AM  Gender:Female  Living status:Living  Apgars:7 ,9  Weight:3660 g   Magnesium Sulfate received: No BMZ received: No Rhophylac:N/A MMR:No T-DaP:Given prenatally Flu: No Transfusion:No  Physical exam  Vitals:   07/27/20 2033 07/28/20 0105 07/28/20 0330 07/28/20 0809  BP: 133/84 131/69 136/82 127/82  Pulse: 75 71 71 77  Resp: 20 18 20 20   Temp: 98.6 F (37 C) 97.7 F (36.5 C) 98.3 F (36.8 C) 98.3 F (36.8 C)  TempSrc: Oral Oral Oral Oral  SpO2:  100% 99% 99%  Weight:      Height:       General: alert, cooperative and no distress Lochia: appropriate Uterine Fundus: firm Incision:  Healing well with no significant drainage DVT Evaluation: No evidence of DVT seen on physical exam. Negative Homan's sign. No cords or calf tenderness. Labs: Lab Results  Component Value Date   WBC 16.6 (H) 07/27/2020   HGB 9.5 (L) 07/27/2020   HCT 27.7 (L) 07/27/2020   MCV 89.6 07/27/2020   PLT 206 07/27/2020   CMP Latest Ref Rng & Units 07/26/2020  Glucose 70 - 99 mg/dL 117(H)  BUN 6 - 20 mg/dL 5(L)  Creatinine 0.44 - 1.00 mg/dL 0.73  Sodium 135 - 145 mmol/L 136  Potassium 3.5 - 5.1 mmol/L 3.8  Chloride 98 - 111 mmol/L 106  CO2 22 - 32 mmol/L 22  Calcium 8.9 - 10.3 mg/dL 8.8(L)  Total Protein 6.5 - 8.1 g/dL 5.7(L)  Total Bilirubin 0.3 - 1.2 mg/dL 0.8  Alkaline Phos 38 - 126 U/L 127(H)  AST 15 - 41 U/L 37  ALT 0 - 44 U/L 17   Edinburgh Score: Edinburgh Postnatal Depression Scale Screening Tool 07/26/2020  I have been able to laugh and see the funny side of things. 0  I have looked forward with enjoyment to things. 0  I have blamed myself unnecessarily when things went wrong. 0  I have been anxious or worried for no good reason. 0  I have felt scared or panicky for no good reason. 0  Things have been getting on  top of me. 0  I have been so unhappy that I have had difficulty sleeping. 0  I have felt sad or miserable. 0  I have been so unhappy that I have been crying. 0  The thought of harming myself has occurred to me. 0  Edinburgh Postnatal Depression Scale Total 0      After visit meds:  Allergies as of 07/28/2020      Reactions   Latex    irritation            Discharge home in stable condition Infant Feeding: Breast Infant Disposition:home with mother Discharge instruction: per After Visit Summary and Postpartum booklet. Activity: Advance as tolerated. Pelvic rest for 6 weeks.  Diet: routine diet Anticipated Birth Control: PP Depo given Postpartum Appointment:6 weeks Additional Postpartum F/U: none Future Appointments:No future appointments. Follow  up Visit:  [place], [place], CNM Follow up in 6 week(s).   Specialties: Obstetrics, Gynecology Why: Please call the office and make an appointment to see Joycelyn Schmid in 6 weeks. Contact information: [address]. [place] [address] [phone]                   07/28/2020 Imagene Riches, CNM

## 2020-07-28 MED ORDER — IBUPROFEN 600 MG PO TABS: 600.0000 mg | ORAL_TABLET | Freq: Four times a day (QID) | ORAL | 0 refills | Status: AC

## 2020-07-28 MED ORDER — MEDROXYPROGESTERONE ACETATE 150 MG/ML IM SUSP
150.0000 mg | INTRAMUSCULAR | Status: DC
  Administered 2020-07-28: 150 mg via INTRAMUSCULAR
  Filled 2020-07-28 (×2): qty 1

## 2020-07-28 NOTE — Progress Notes (Signed)
Mother discharged.  Discharge instructions given.  Mother verbalizes understanding.  Transported by auxiliary.  

## 2020-07-28 NOTE — Lactation Note (Signed)
This note was copied from a baby's chart. Lactation Consultation Note  Patient Name: Rebekah Dennis Date: 07/28/2020 Reason for consult: Follow-up assessment;1st time breastfeeding;Term (elevated bili)  Lactation follow-up before discharge. Mom continues to exclusively BF, no pain or discomfort noted. Baby was active at the breast when LC entered, sunnyside up with head turned. LC talked mom through turning baby tummy to mummy, and why this is important for optimal milk transfer. Mom independently altered baby's position.  LC reviewed signs of milk transfer, wet/stool for DOL, newborn feeding patterns, potential for growth spurts and cluster feeding, and breast and nipple care.  Information for outpatient lactation services and community breastfeeding support given. Encouraged mom to continue feeding on demand, use of breast massage and compression throughout feed, good position/latch, and to call out for support as needed before discharge.  Maternal Data Formula Feeding for Exclusion: No Has patient been taught Hand Expression?: Yes Does the patient have breastfeeding experience prior to this delivery?: No  Feeding Feeding Type: Breast Fed  LATCH Score Latch: Grasps breast easily, tongue down, lips flanged, rhythmical sucking.  Audible Swallowing: Spontaneous and intermittent  Type of Nipple: Everted at rest and after stimulation  Comfort (Breast/Nipple): Soft / non-tender  Hold (Positioning): Assistance needed to correctly position infant at breast and maintain latch. (turned baby in (sunnyside up))  LATCH Score: 9  Interventions Interventions: Breast feeding basics reviewed;Adjust position  Lactation Tools Discussed/Used     Consult Status Consult Status: Complete Date: 07/28/20 Follow-up type: Call as needed    Danford Bad 07/28/2020, 10:08 AM

## 2020-09-06 ENCOUNTER — Encounter: Payer: Self-pay | Admitting: Obstetrics

## 2020-09-06 ENCOUNTER — Other Ambulatory Visit (HOSPITAL_COMMUNITY)
Admission: RE | Admit: 2020-09-06 | Discharge: 2020-09-06 | Disposition: A | Discharge status: Home / Self Care | Payer: Medicaid Other | Source: Ambulatory Visit | Attending: Obstetrics | Admitting: Obstetrics

## 2020-09-06 ENCOUNTER — Other Ambulatory Visit: Payer: Self-pay

## 2020-09-06 ENCOUNTER — Ambulatory Visit (INDEPENDENT_AMBULATORY_CARE_PROVIDER_SITE_OTHER): Payer: Medicaid Other | Admitting: Obstetrics

## 2020-09-06 DIAGNOSIS — Z124 Encounter for screening for malignant neoplasm of cervix: Secondary | ICD-10-CM | POA: Diagnosis present

## 2020-09-06 NOTE — Progress Notes (Signed)
Postpartum Visit  Chief Complaint:  Chief Complaint  Patient presents with  . Postpartum Care    History of Present Illness: Rebekah Dennis is a 24 y.o. G1P1001 presents for postpartum visit.  Date of delivery: 07/26/2020  Type of delivery: Vaginal delivery - Vacuum or forceps assisted  no Episiotomy No.  Laceration: yes  1st degree laceration Pregnancy or labor problems:  no Any problems since the delivery:  no  Newborn Details:  SINGLETON :  1. Baby's name: boy. Birth weight: 3660gm Maternal Details:  Breast Feeding:  yes Post partum depression/anxiety noted:  no Edinburgh Post-Partum Depression Score: 3   Date of last PAP: 2020  normal   Review of Systems: Review of Systems  Constitutional: Negative.   HENT: Negative.   Eyes: Negative.   Respiratory: Negative.   Cardiovascular: Negative.   Gastrointestinal: Negative.   Genitourinary: Negative.   Musculoskeletal: Negative.   Skin: Negative.   Neurological: Negative.   Endo/Heme/Allergies: Negative.   Psychiatric/Behavioral: Negative.     Past Medical History:  Past Medical History:  Diagnosis Date  . Chronic dermatitis   . History of UTI     Past Surgical History:  Past Surgical History:  Procedure Laterality Date  . Denies surgical history      Family History:  Family History  Problem Relation Age of Onset  . Heart attack Maternal Grandfather   . Heart attack Paternal Grandmother   . Stroke Paternal Grandmother     Social History:  Social History   Socioeconomic History  . Marital status: Single    Spouse name: Not on file  . Number of children: Not on file  . Years of education: Not on file  . Highest education level: Not on file  Occupational History  . Not on file  Tobacco Use  . Smoking status: Former Smoker    Packs/day: 0.25    Years: 9.00    Pack years: 2.25    Types: Cigarettes    Quit date: 01/02/2020    Years since quitting: 0.6  . Smokeless tobacco: Never Used  Vaping  Use  . Vaping Use: Never used  Substance and Sexual Activity  . Alcohol use: Not Currently    Alcohol/week: 0.0 standard drinks    Comment: Last ETOH use 01/02/2020  . Drug use: Yes    Types: Marijuana    Comment: Last week use 01/01/2020  . Sexual activity: Yes    Partners: Male    Birth control/protection: Injection  Other Topics Concern  . Not on file  Social History Narrative  . Not on file   Social Determinants of Health   Financial Resource Strain:   . Difficulty of Paying Living Expenses: Not on file  Food Insecurity:   . Worried About Programme researcher, broadcasting/film/video in the Last Year: Not on file  . Ran Out of Food in the Last Year: Not on file  Transportation Needs:   . Lack of Transportation (Medical): Not on file  . Lack of Transportation (Non-Medical): Not on file  Physical Activity:   . Days of Exercise per Week: Not on file  . Minutes of Exercise per Session: Not on file  Stress:   . Feeling of Stress : Not on file  Social Connections:   . Frequency of Communication with Friends and Family: Not on file  . Frequency of Social Gatherings with Friends and Family: Not on file  . Attends Religious Services: Not on file  . Active  Member of Clubs or Organizations: Not on file  . Attends Banker Meetings: Not on file  . Marital Status: Not on file  Intimate Partner Violence: Not At Risk  . Fear of Current or Ex-Partner: No  . Emotionally Abused: No  . Physically Abused: No  . Sexually Abused: No    Allergies:  Allergies  Allergen Reactions  . Latex     irritation    Medications: Prior to Admission medications   Medication Sig Start Date End Date Taking? Authorizing Provider  ibuprofen (ADVIL) 600 MG tablet Take 1 tablet (600 mg total) by mouth every 6 (six) hours. 07/28/20  Yes Mirna Mires, CNM  Prenatal Vit-Fe Phos-FA-Omega (VITAFOL GUMMIES) 3.33-0.333-34.8 MG CHEW Chew 1 tablet by mouth daily. 01/25/20  Yes Vena Austria, MD    Physical  Exam Vitals:  Vitals:   09/06/20 1531  BP: 116/70    General: NAD HEENT: normocephalic, anicteric Neck: No thyroid enlargement, no palpable nodules, no cervical lymphadenpathy Breast: Lactating, no inflammation, no masses, nipples intact Pulmonary: No increased work of breathing, CTAB Abdomen: Soft, non-tender, non-distended.  Umbilicus without lesions.  No hepatomegaly or masses palpable. No evidence of hernia. Genitourinary:  External: Well healed perineum, no lesions or inflammation    Vagina: Normal vaginal mucosa, no evidence of prolapse.    Cervix: Grossly normal in appearance, no bleeding  Uterus: Well involuted, mobile, non-tender  Adnexa: No adnexal masses, non-tender  Rectal: deferred Extremities: no edema, erythema, or tenderness Neurologic: Grossly intact Psychiatric: mood appropriate, affect full  Assessment: 24 y.o. G1P1001 presenting for 6 week postpartum visit  Plan: PP physical completed.  1) Contraception Education given regarding options for contraception, including injectable contraception. Patient would like to use Depo for contraception. She received a Depo shot on 07/27/20, and is thus due on 12/29 for her next shot.  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 - ASCCP guidelines and rational discussed.  Patient opts for annual screening interval.  3) Patient underwent screening for postpartum depression with no concerns noted.  4) Discussed return to normal activity, recommend continuing prenatal vitamins.  5) Follow up 1 year for routine annual exam and every three months for Depo. Mirna Mires, CNM  09/06/2020 4:11 PM

## 2020-09-12 LAB — CYTOLOGY - PAP
Comment: NEGATIVE
Comment: NEGATIVE
Diagnosis: UNDETERMINED — AB
HPV 16: NEGATIVE
HPV 18 / 45: NEGATIVE
High risk HPV: POSITIVE — AB

## 2020-09-14 NOTE — Progress Notes (Signed)
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 She plans to RTC in one year for a repeat Pap smear. At that time should she again have an ASCUS pap/ with HPV, we will set her up for colposcopy. All this was discussed with her , and she verbalized understanding. Mirna Mires, CNM  09/14/2020 3:25 PM

## 2020-10-25 ENCOUNTER — Other Ambulatory Visit: Payer: Self-pay | Admitting: Obstetrics

## 2020-10-25 ENCOUNTER — Ambulatory Visit: Payer: Medicaid Other

## 2020-10-25 ENCOUNTER — Telehealth: Payer: Self-pay

## 2020-10-25 DIAGNOSIS — Z3042 Encounter for surveillance of injectable contraceptive: Secondary | ICD-10-CM

## 2020-10-25 MED ORDER — MEDROXYPROGESTERONE ACETATE 150 MG/ML IM SUSP: 150.0000 mg | Freq: Once | INTRAMUSCULAR | Status: AC

## 2020-10-25 NOTE — Telephone Encounter (Signed)
Pt requesting refill on Depo. Pt was seen for 6 week PP visit 08/2020. Can you please RX this since there is not a RX in her medications I cannot do it. Thank you

## 2020-10-26 ENCOUNTER — Telehealth: Payer: Self-pay

## 2020-10-26 ENCOUNTER — Other Ambulatory Visit: Payer: Self-pay

## 2020-10-26 ENCOUNTER — Ambulatory Visit (INDEPENDENT_AMBULATORY_CARE_PROVIDER_SITE_OTHER): Payer: Medicaid Other

## 2020-10-26 DIAGNOSIS — Z3042 Encounter for surveillance of injectable contraceptive: Secondary | ICD-10-CM

## 2020-10-26 MED ORDER — MEDROXYPROGESTERONE ACETATE 150 MG/ML IM SUSP
150.0000 mg | Freq: Once | INTRAMUSCULAR | Status: AC
  Administered 2020-10-26: 16:00:00 150 mg via INTRAMUSCULAR

## 2020-10-26 NOTE — Telephone Encounter (Signed)
Called into pharm  

## 2020-10-26 NOTE — Telephone Encounter (Signed)
Can you call pharmacy and call this depo refill in.

## 2021-01-17 ENCOUNTER — Other Ambulatory Visit: Payer: Self-pay

## 2021-01-17 MED ORDER — MEDROXYPROGESTERONE ACETATE 150 MG/ML IM SUSP: INTRAMUSCULAR | 1 refills | Status: AC

## 2021-01-18 ENCOUNTER — Ambulatory Visit: Payer: Medicaid Other

## 2021-02-17 ENCOUNTER — Encounter: Payer: Self-pay | Admitting: Physician Assistant

## 2021-02-17 ENCOUNTER — Ambulatory Visit: Payer: Medicaid Other | Admitting: Physician Assistant

## 2021-02-17 ENCOUNTER — Other Ambulatory Visit: Payer: Self-pay

## 2021-02-17 DIAGNOSIS — Z113 Encounter for screening for infections with a predominantly sexual mode of transmission: Secondary | ICD-10-CM

## 2021-02-17 DIAGNOSIS — Z202 Contact with and (suspected) exposure to infections with a predominantly sexual mode of transmission: Secondary | ICD-10-CM

## 2021-02-17 DIAGNOSIS — Z3009 Encounter for other general counseling and advice on contraception: Secondary | ICD-10-CM

## 2021-02-17 LAB — WET PREP FOR TRICH, YEAST, CLUE
Clue Cell Exam: POSITIVE — AB
Trichomonas Exam: NEGATIVE
Yeast Exam: NEGATIVE

## 2021-02-17 MED ORDER — PENICILLIN G BENZATHINE 1200000 UNIT/2ML IM SUSP
2.4000 10*6.[IU] | Freq: Once | INTRAMUSCULAR | Status: AC
  Administered 2021-02-17: 2.4 10*6.[IU] via INTRAMUSCULAR

## 2021-02-17 NOTE — Progress Notes (Signed)
[facility] Department STI clinic/screening visit  Subjective:  Rebekah Dennis is a 25 y.o. female being seen today for an STI screening visit. The patient reports they do not have symptoms.  Patient reports that they do not desire a pregnancy in the next year.   They reported they are not interested in discussing contraception today.  No LMP recorded.   Patient has the following medical conditions:   Patient Active Problem List   Diagnosis Date Noted  . Encounter for planned induction of labor 07/25/2020  . COVID-19 virus infection 03/01/2020  . Chronic dermatitis 08/18/2015    Chief Complaint  Patient presents with  . SEXUALLY TRANSMITTED DISEASE    screening    HPI  Patient reports that she is not having any symptoms but is a contact to Syphilis.  Denies chronic conditions, surgeries and regular medicines.  Reports that she had a baby 07/26/2020 and received Depo after delivery.  Reports that she has been having spotting since delivery despite getting her second Depo 10/26/2020 and that she missed her last shot which was due on 01/18/2021.  Last HIV test was in 2021 and last pap was 08/2020,   See flowsheet for further details and programmatic requirements.    The following portions of the patient's history were reviewed and updated as appropriate: allergies, current medications, past medical history, past social history, past surgical history and problem list.  Objective:  There were no vitals filed for this visit.  Physical Exam Constitutional:      General: She is not in acute distress.    Appearance: Normal appearance.  HENT:     Head: Normocephalic and atraumatic.     Comments: No nits,lice, or hair loss. No cervical, supraclavicular or axillary adenopathy.    Mouth/Throat:     Mouth: Mucous membranes are moist.     Pharynx: Oropharynx is clear. No oropharyngeal exudate or posterior oropharyngeal erythema.  Eyes:     Conjunctiva/sclera: Conjunctivae  normal.  Pulmonary:     Effort: Pulmonary effort is normal.  Abdominal:     Palpations: Abdomen is soft. There is no mass.     Tenderness: There is no abdominal tenderness. There is no guarding or rebound.  Genitourinary:    General: Normal vulva.     Rectum: Normal.     Comments: External genitalia/pubic area without nits, lice, edema, erythema, lesions and inguinal adenopathy. Vagina with normal mucosa and small amount of menstrual blood present. Cervix without visible lesions. Uterus firm, mobile, nt, no masses, no CMT, no adnexal tenderness or fullness. Musculoskeletal:     Cervical back: Neck supple. No tenderness.  Skin:    General: Skin is warm and dry.     Findings: No bruising, erythema, lesion or rash.  Neurological:     Mental Status: She is alert and oriented to person, place, and time.  Psychiatric:        Mood and Affect: Mood normal.        Behavior: Behavior normal.        Thought Content: Thought content normal.        Judgment: Judgment normal.      Assessment and Plan:  Rebekah Dennis is a 25 y.o. female presenting to the [facility] Department for STI screening  1. Screening for STD (sexually transmitted disease) Patient into clinic without symptoms. Reviewed with patient that wet mount is normal and no treatment is indicated today. Rec condoms with all sex. Await test results.  Counseled that RN will call if needs to RTC for treatment once results are back. - WET PREP FOR TRICH, YEAST, CLUE - Chlamydia/Gonorrhea [place] [facility] - HIV [place] [facility] - Syphilis Serology, [facility]  2. Syphilis contact Counseled patient re: Syphilis- transmission, symptoms and treatment based on length of infection. Will treat as a contact with Bicillin 2.4 mu IM today. No sex for 14 days and until after partner completes treatment. Counseled patient re: normal SE after injections and to use OTC analgesics and warm compresses for symptom relief. - penicillin g  benzathine (BICILLIN LA) 1200000 UNIT/2ML injection 2.4 Million Units   3. Encounter for counseling regarding contraception Counseled patient re: normal SE of Depo and that it is common to have irregular bleeding until you have had 3-4 shots in a row. Enc patient to call her PCP and schedule for restarting Depo or changing to other method. Counseled patient that she can try OTC IB 800 mg q 8 hr with food or milk for 5 days in a row to try to d/c bleeding.     No follow-ups on file.  No future appointments.  Matt Holmes, PA

## 2021-02-23 LAB — HM HIV SCREENING LAB: HM HIV Screening: NEGATIVE

## 2021-03-28 ENCOUNTER — Other Ambulatory Visit: Payer: Self-pay

## 2021-03-28 ENCOUNTER — Emergency Department
Admission: EM | Admit: 2021-03-28 | Discharge: 2021-03-28 | Disposition: A | Discharge status: Home / Self Care | Payer: Medicaid Other | Attending: Emergency Medicine | Admitting: Emergency Medicine

## 2021-03-28 DIAGNOSIS — Z20822 Contact with and (suspected) exposure to covid-19: Secondary | ICD-10-CM | POA: Diagnosis not present

## 2021-03-28 DIAGNOSIS — J069 Acute upper respiratory infection, unspecified: Secondary | ICD-10-CM | POA: Diagnosis not present

## 2021-03-28 DIAGNOSIS — Z91040 Latex allergy status: Secondary | ICD-10-CM | POA: Diagnosis not present

## 2021-03-28 DIAGNOSIS — Z87891 Personal history of nicotine dependence: Secondary | ICD-10-CM | POA: Diagnosis not present

## 2021-03-28 DIAGNOSIS — Z8616 Personal history of COVID-19: Secondary | ICD-10-CM | POA: Diagnosis not present

## 2021-03-28 DIAGNOSIS — R0981 Nasal congestion: Secondary | ICD-10-CM | POA: Diagnosis present

## 2021-03-28 MED ORDER — FLUTICASONE PROPIONATE 50 MCG/ACT NA SUSP: 1.0000 | Freq: Every day | NASAL | 0 refills | Status: DC

## 2021-03-28 NOTE — ED Provider Notes (Signed)
[facility] Emergency Department Provider Note  ____________________________________________   Event Date/Time   First MD Initiated Contact with Patient 03/28/21 1612     (approximate)  I have reviewed the triage vital signs and the nursing notes.   HISTORY  Chief Complaint Sore Throat   HPI Rebekah Dennis is a 25 y.o. female who reports to the emergency department for evaluation of nasal congestion over the past 3 days with some associated sore throat.  She denies any fever, cough, chest pain, shortness of breath.  She also complains of irritation to the right eye when she earlier in the day had a foreign body sensation, however this has subsided.  She denies any drainage or current pain in the eye.  Denies blurred vision, does not wear contacts.        Past Medical History:  Diagnosis Date  . Chronic dermatitis   . History of UTI   . Postpartum care following vaginal delivery 07/26/2020   SVD 07/26/20    Patient Active Problem List   Diagnosis Date Noted  . Encounter for planned induction of labor 07/25/2020  . COVID-19 virus infection 03/01/2020  . Chronic dermatitis 08/18/2015    Past Surgical History:  Procedure Laterality Date  . Denies surgical history      Prior to Admission medications   Medication Sig Start Date End Date Taking? Authorizing Provider  fluticasone (FLONASE) 50 MCG/ACT nasal spray Place 1 spray into both nostrils daily. 03/28/21 04/27/21 Yes Artha Stavros, Ruben Gottron, PA  ibuprofen (ADVIL) 600 MG tablet Take 1 tablet (600 mg total) by mouth every 6 (six) hours. 07/28/20   Mirna Mires, CNM  medroxyPROGESTERone (DEPO-PROVERA) 150 MG/ML injection INJECT ONCE EVERY 3 MONTHS 01/17/21   Mirna Mires, CNM  Prenatal Vit-Fe Phos-FA-Omega (VITAFOL GUMMIES) 3.33-0.333-34.8 MG CHEW Chew 1 tablet by mouth daily. 01/25/20   Vena Austria, MD    Allergies Latex  Family History  Problem Relation Age of Onset  . Heart attack  Maternal Grandfather   . Heart attack Paternal Grandmother   . Stroke Paternal Grandmother     Social History Social History   Tobacco Use  . Smoking status: Former Smoker    Packs/day: 0.25    Years: 9.00    Pack years: 2.25    Types: Cigarettes    Quit date: 01/02/2020    Years since quitting: 1.2  . Smokeless tobacco: Never Used  Vaping Use  . Vaping Use: Never used  Substance Use Topics  . Alcohol use: Not Currently    Alcohol/week: 0.0 standard drinks    Comment: Last ETOH use 01/02/2020  . Drug use: Yes    Types: Marijuana    Comment: Last week use 01/01/2020    Review of Systems Constitutional: No fever/chills Eyes: + Right eye redness, no visual changes. ENT: + sore throat, + nasal congestion Cardiovascular: Denies chest pain. Respiratory: Denies shortness of breath. Gastrointestinal: No abdominal pain.  No nausea, no vomiting.  No diarrhea.  No constipation. Genitourinary: Negative for dysuria. Musculoskeletal: Negative for back pain. Skin: Negative for rash. Neurological: Negative for headaches, focal weakness or numbness.   ____________________________________________   PHYSICAL EXAM:  VITAL SIGNS: ED Triage Vitals  Enc Vitals Group     BP 03/28/21 1604 (!) 124/99     Pulse Rate 03/28/21 1604 98     Resp 03/28/21 1604 18     Temp 03/28/21 1604 98.3 F (36.8 C)     Temp Source 03/28/21 1604  Oral     SpO2 03/28/21 1604 98 %     Weight 03/28/21 1640 156 lb 4.9 oz (70.9 kg)     Height 03/28/21 1640 5\' 10"  (1.778 m)     Head Circumference --      Peak Flow --      Pain Score 03/28/21 1559 5     Pain Loc --      Pain Edu? --      Excl. in GC? --    Constitutional: Alert and oriented. Well appearing and in no acute distress. Eyes: Right conjunctiva is very mildly erythematous, no drainage noted. PERRL. EOMI. Head: Atraumatic. Nose: Mild congestion/rhinnorhea. Ears: Left TM is visualized, pearly gray with no erythema or fluid level line.  The right TM  is pearly gray with no bulging, however there is air-fluid level. Mouth/Throat: Mucous membranes are moist.  Oropharynx very mildly erythematous with no tonsillar enlargement or exudate. Neck: No stridor.   Lymphatic: No cervical lymphadenopathy Cardiovascular: Normal rate, regular rhythm. Grossly normal heart sounds.  Good peripheral circulation. Respiratory: Normal respiratory effort.  No retractions. Lungs CTAB. Gastrointestinal: Soft and nontender. No distention. No abdominal bruits. No CVA tenderness. Musculoskeletal: No lower extremity tenderness nor edema.  No joint effusions. Neurologic:  Normal speech and language. No gross focal neurologic deficits are appreciated. No gait instability. Skin:  Skin is warm, dry and intact. No rash noted. Psychiatric: Mood and affect are normal. Speech and behavior are normal.  ____________________________________________   LABS (all labs ordered are listed, but only abnormal results are displayed)  Labs Reviewed  SARS CORONAVIRUS 2 (TAT 6-24 HRS)    ____________________________________________   INITIAL IMPRESSION / ASSESSMENT AND PLAN / ED COURSE  As part of my medical decision making, I reviewed the following data within the electronic MEDICAL RECORD NUMBER Nursing notes reviewed and incorporated and Notes from prior ED visits        Patient is a 25 year old female who reports to the emergency department for evaluation of nasal congestion and sore throat x3 days without fever, cough, chest pain or other significant symptoms.  See HPI for further details.  In triage, patient has grossly normal vital signs.  Physical exam as above.  Her son is being evaluated today in the ER for similar symptoms.  Exam most consistent with viral URI/viral pharyngitis.  Did recommend Flonase for her nasal congestion and to decrease the fluid behind her right ear.  Return precautions were discussed and patient is amenable with plan.  She stable this time for  outpatient management.      ____________________________________________   FINAL CLINICAL IMPRESSION(S) / ED DIAGNOSES  Final diagnoses:  Viral URI     ED Discharge Orders         Ordered    fluticasone (FLONASE) 50 MCG/ACT nasal spray  Daily        03/28/21 1654           Note:  This document was prepared using Dragon voice recognition software and may include unintentional dictation errors.   03/30/21, PA 03/28/21 2047    2048, MD 03/29/21 1045

## 2021-03-28 NOTE — Discharge Instructions (Addendum)
Take flonase as directed. You may also take Mucinex over the counter if you develop any worsening of symptoms or cough. Follow up with PCP or return with any worsening.

## 2021-03-28 NOTE — ED Triage Notes (Signed)
Pt comes with c/o right red eye, congestion and sore throat.

## 2021-03-28 NOTE — ED Notes (Signed)
See triage note  Presents with congestion and sore throat  Also having some irritation to right eye  Afebrile on arrival

## 2021-03-29 LAB — SARS CORONAVIRUS 2 (TAT 6-24 HRS): SARS Coronavirus 2: NEGATIVE

## 2021-07-14 ENCOUNTER — Ambulatory Visit: Payer: Medicaid Other | Admitting: Physician Assistant

## 2021-07-14 ENCOUNTER — Other Ambulatory Visit: Payer: Self-pay

## 2021-07-14 DIAGNOSIS — Z113 Encounter for screening for infections with a predominantly sexual mode of transmission: Secondary | ICD-10-CM

## 2021-07-14 DIAGNOSIS — A5901 Trichomonal vulvovaginitis: Secondary | ICD-10-CM

## 2021-07-14 DIAGNOSIS — Z202 Contact with and (suspected) exposure to infections with a predominantly sexual mode of transmission: Secondary | ICD-10-CM

## 2021-07-14 LAB — WET PREP FOR TRICH, YEAST, CLUE
Trichomonas Exam: POSITIVE — AB
Yeast Exam: NEGATIVE

## 2021-07-14 MED ORDER — METRONIDAZOLE 500 MG PO TABS: 500.0000 mg | ORAL_TABLET | Freq: Two times a day (BID) | ORAL | 0 refills | Status: AC

## 2021-07-14 NOTE — Progress Notes (Signed)
Pt here for STD screening and a contact to Trich.  Wet mount results reviewed and medication dispensed per Provider orders.  Pt declined condoms. Berdie Ogren, RN

## 2021-07-15 ENCOUNTER — Encounter: Payer: Self-pay | Admitting: Physician Assistant

## 2021-07-15 NOTE — Progress Notes (Signed)
[facility] Department STI clinic/screening visit  Subjective:  Rebekah Dennis is a 25 y.o. female being seen today for an STI screening visit. The patient reports they do have symptoms.  Patient reports that they do not desire a pregnancy in the next year.   They reported they are not interested in discussing contraception today.  Patient's last menstrual period was 07/10/2021.   Patient has the following medical conditions:   Patient Active Problem List   Diagnosis Date Noted   Encounter for planned induction of labor 07/25/2020   COVID-19 virus infection 03/01/2020   Chronic dermatitis 08/18/2015    Chief Complaint  Patient presents with   SEXUALLY TRANSMITTED DISEASE    Screening    HPI  Patient reports that she has had some vaginal irritation for a few days and she is also a contact to Trich.  Denies chronic conditions, surgeries and regular medicines.  LMP was 07/10/2021 and using condoms sometimes as BCM.  States last HIV test was 01/2021 and last pap was 08/2020.    See flowsheet for further details and programmatic requirements.    The following portions of the patient's history were reviewed and updated as appropriate: allergies, current medications, past medical history, past social history, past surgical history and problem list.  Objective:  There were no vitals filed for this visit.  Physical Exam Constitutional:      General: She is not in acute distress.    Appearance: Normal appearance.  HENT:     Head: Normocephalic and atraumatic.     Comments: No nits,lice, or hair loss. No cervical, supraclavicular or axillary adenopathy.     Mouth/Throat:     Mouth: Mucous membranes are moist.     Pharynx: Oropharynx is clear. No oropharyngeal exudate or posterior oropharyngeal erythema.  Eyes:     Conjunctiva/sclera: Conjunctivae normal.  Pulmonary:     Effort: Pulmonary effort is normal.  Abdominal:     Palpations: Abdomen is soft. There is no mass.      Tenderness: There is no abdominal tenderness. There is no guarding or rebound.  Genitourinary:    General: Normal vulva.     Rectum: Normal.     Comments: External genitalia/pubic area without nits, lice, edema, erythema, lesions and inguinal adenopathy. Vagina with normal mucosa and moderate amount of thin, pinkish/bloody discharge. Cervix without visible lesions. Uterus firm, mobile, nt, no masses, no CMT, no adnexal tenderness or fullness.  Musculoskeletal:     Cervical back: Neck supple. No tenderness.  Skin:    General: Skin is warm and dry.     Findings: No bruising, erythema, lesion or rash.  Neurological:     Mental Status: She is alert and oriented to person, place, and time.  Psychiatric:        Mood and Affect: Mood normal.        Behavior: Behavior normal.        Thought Content: Thought content normal.        Judgment: Judgment normal.     Assessment and Plan:  Bonney T Offield is a 25 y.o. female presenting to the [facility] Department for STI screening  1. Screening for STD (sexually transmitted disease) Patient into clinic with symptoms. Reviewed wet mount results.  Rec condoms with all sex. Await test results.  Counseled that RN will call if needs to RTC for treatment once results are back.  - WET PREP FOR TRICH, YEAST, CLUE - Chlamydia/Gonorrhea [place] [facility] - [facility]  [facility] - Syphilis Serology, [facility]  2. Trichomonal vulvovaginitis Treat Trich with Metronidazole 500 mg #14 1 po BID for 7 days with food, no EtOH for 24 hr before and until 72 hr after completing medicine. No sex for 10 days. Enc to use OTC antifungal cream if has itching during or just after antibiotic use.  - metroNIDAZOLE (FLAGYL) 500 MG tablet; Take 1 tablet (500 mg total) by mouth 2 (two) times daily for 7 days.  Dispense: 14 tablet; Refill: 0      No follow-ups on file.  No future appointments.  Matt Holmes, PA

## 2021-10-12 ENCOUNTER — Emergency Department
Admission: EM | Admit: 2021-10-12 | Discharge: 2021-10-12 | Disposition: A | Discharge status: Home / Self Care | Payer: BLUE CROSS/BLUE SHIELD | Attending: Student in an Organized Health Care Education/Training Program | Admitting: Student in an Organized Health Care Education/Training Program

## 2021-10-12 ENCOUNTER — Encounter: Payer: Self-pay | Admitting: Emergency Medicine

## 2021-10-12 ENCOUNTER — Other Ambulatory Visit: Payer: Self-pay

## 2021-10-12 DIAGNOSIS — Z87891 Personal history of nicotine dependence: Secondary | ICD-10-CM | POA: Insufficient documentation

## 2021-10-12 DIAGNOSIS — Z91040 Latex allergy status: Secondary | ICD-10-CM | POA: Diagnosis not present

## 2021-10-12 DIAGNOSIS — Z8616 Personal history of COVID-19: Secondary | ICD-10-CM | POA: Insufficient documentation

## 2021-10-12 DIAGNOSIS — J029 Acute pharyngitis, unspecified: Secondary | ICD-10-CM | POA: Diagnosis present

## 2021-10-12 DIAGNOSIS — Z20822 Contact with and (suspected) exposure to covid-19: Secondary | ICD-10-CM | POA: Insufficient documentation

## 2021-10-12 DIAGNOSIS — J020 Streptococcal pharyngitis: Secondary | ICD-10-CM | POA: Diagnosis not present

## 2021-10-12 LAB — RESP PANEL BY RT-PCR (FLU A&B, COVID) ARPGX2
Influenza A by PCR: NEGATIVE
Influenza B by PCR: NEGATIVE
SARS Coronavirus 2 by RT PCR: NEGATIVE

## 2021-10-12 LAB — GROUP A STREP BY PCR: Group A Strep by PCR: DETECTED — AB

## 2021-10-12 MED ORDER — AMOXICILLIN 875 MG PO TABS: 875.0000 mg | ORAL_TABLET | Freq: Two times a day (BID) | ORAL | 0 refills | Status: AC

## 2021-10-12 NOTE — ED Provider Notes (Signed)
[facility] Emergency Department Provider Note  ____________________________________________   Event Date/Time   First MD Initiated Contact with Patient 10/12/21 [phone]     (approximate)  I have reviewed the triage vital signs and the nursing notes.   HISTORY  Chief Complaint Sore Throat   HPI Rebekah Dennis is a 25 y.o. female this to the ED with complaint of sore throat that started 3 days ago.  Patient denies any nausea, vomiting or diarrhea.  She states that one of her children was recently diagnosed with the flu.  She rates her pain as a 5 out of 10.       Past Medical History:  Diagnosis Date   Chronic dermatitis    Encounter for planned induction of labor 07/25/2020   History of UTI    Postpartum care following vaginal delivery 07/26/2020   SVD 07/26/20    Patient Active Problem List   Diagnosis Date Noted   COVID-19 virus infection 03/01/2020   Chronic dermatitis 08/18/2015    Past Surgical History:  Procedure Laterality Date   Denies surgical history      Prior to Admission medications   Medication Sig Start Date End Date Taking? Authorizing Provider  amoxicillin (AMOXIL) 875 MG tablet Take 1 tablet (875 mg total) by mouth 2 (two) times daily. 10/12/21  Yes Bridget Hartshorn L, PA-C  fluticasone (FLONASE) 50 MCG/ACT nasal spray Place 1 spray into both nostrils daily. 03/28/21 04/27/21  Lucy Chris, PA  ibuprofen (ADVIL) 600 MG tablet Take 1 tablet (600 mg total) by mouth every 6 (six) hours. Patient not taking: Reported on 07/15/2021 07/28/20   Mirna Mires, CNM  medroxyPROGESTERone (DEPO-PROVERA) 150 MG/ML injection INJECT ONCE EVERY 3 MONTHS Patient not taking: Reported on 07/15/2021 01/17/21   Mirna Mires, CNM  Prenatal Vit-Fe Phos-FA-Omega (VITAFOL GUMMIES) 3.33-0.333-34.8 MG CHEW Chew 1 tablet by mouth daily. Patient not taking: Reported on 07/15/2021 01/25/20   Vena Austria, MD    Allergies Latex  Family History   Problem Relation Age of Onset   Heart attack Maternal Grandfather    Heart attack Paternal Grandmother    Stroke Paternal Grandmother     Social History Social History   Tobacco Use   Smoking status: Former    Packs/day: 0.25    Years: 9.00    Pack years: 2.25    Types: Cigarettes    Quit date: 01/02/2020    Years since quitting: 1.7   Smokeless tobacco: Never  Vaping Use   Vaping Use: Never used  Substance Use Topics   Alcohol use: Not Currently    Alcohol/week: 0.0 standard drinks    Comment: Last ETOH use 01/02/2020   Drug use: Yes    Types: Marijuana    Comment: Last week use 01/01/2020    Review of Systems Constitutional: No fever/chills Eyes: No visual changes. ENT: Positive sore throat. Cardiovascular: Denies chest pain. Respiratory: Denies shortness of breath. Gastrointestinal: No abdominal pain.  No nausea, no vomiting.  No diarrhea.   Musculoskeletal: Negative for muscle aches. Skin: Negative for rash. Neurological: Negative for headaches, focal weakness or numbness.  ____________________________________________   PHYSICAL EXAM:  VITAL SIGNS: ED Triage Vitals  Enc Vitals Group     BP 10/12/21 0858 130/84     Pulse Rate 10/12/21 0858 87     Resp 10/12/21 0858 18     Temp 10/12/21 0858 98.4 F (36.9 C)     Temp Source 10/12/21 0858 Oral  SpO2 10/12/21 0858 100 %     Weight 10/12/21 0849 156 lb 4.9 oz (70.9 kg)     Height 10/12/21 0849 5\' 10"  (1.778 m)     Head Circumference --      Peak Flow --      Pain Score 10/12/21 0848 5     Pain Loc --      Pain Edu? --      Excl. in GC? --     Constitutional: Alert and oriented. Well appearing and in no acute distress. Eyes: Conjunctivae are normal. PERRL. EOMI. Head: Atraumatic. Nose: No congestion/rhinnorhea. Mouth/Throat: Mucous membranes are moist.  Oropharynx minimal erythema with questionable exudate noted to the right tonsillar area. Neck: No stridor.   Cardiovascular: Normal rate, regular  rhythm. Grossly normal heart sounds.  Good peripheral circulation. Respiratory: Normal respiratory effort.  No retractions. Lungs CTAB. Gastrointestinal: Soft and nontender. No distention.  Musculoskeletal: Moves upper and lower extremities they have difficulty and normal gait was noted. Neurologic:  Normal speech and language. No gross focal neurologic deficits are appreciated. No gait instability. Skin:  Skin is warm, dry and intact. No rash noted. Psychiatric: Mood and affect are normal. Speech and behavior are normal.  ____________________________________________   LABS (all labs ordered are listed, but only abnormal results are displayed)  Labs Reviewed  GROUP A STREP BY PCR - Abnormal; Notable for the following components:      Result Value   Group A Strep by PCR DETECTED (*)    All other components within normal limits  RESP PANEL BY RT-PCR (FLU A&B, COVID) [id number]   ____________________________________________    PROCEDURES  Procedure(s) performed (including Critical Care):  Procedures   ____________________________________________   INITIAL IMPRESSION / ASSESSMENT AND PLAN / ED COURSE  As part of my medical decision making, I reviewed the following data within the electronic MEDICAL RECORD NUMBER Notes from prior ED visits and [place] Controlled Substance Database  25 year old female presents to the ED with complaint of sore throat for approximately 4 days.  COVID and influenza test were negative.  Strep test was positive and patient was made aware.  A prescription for amoxicillin 875 twice daily for 10 days was sent to her pharmacy.  She is aware that she may take Tylenol or ibuprofen as needed for throat pain.  Patient was given a note for work also.   ____________________________________________   FINAL CLINICAL IMPRESSION(S) / ED DIAGNOSES  Final diagnoses:  Strep pharyngitis     ED Discharge Orders          Ordered    amoxicillin (AMOXIL) 875 MG tablet  2  times daily        10/12/21 1048             Note:  This document was prepared using Dragon voice recognition software and may include unintentional dictation errors.    10/14/21, PA-C 10/12/21 1052    10/14/21, MD 10/12/21 1345

## 2021-10-12 NOTE — ED Triage Notes (Signed)
Pt comes into the ED via POV c/o sore throat that started on Monday.  Pt states her child was recently diagnosed with the flu.  Pt in NAD with even and unlabored respirations.

## 2021-10-12 NOTE — Discharge Instructions (Signed)
Follow-up with your primary care provider if any continued problems or concerns.  You may take Tylenol or ibuprofen as needed for fever or sore throat.  Drink lots of fluids to stay hydrated.  A prescription for amoxicillin 875 mg twice daily for 10 days was sent to your pharmacy.  This should be taken completely.  Also throw away your toothbrush as it also is contaminated with the strep germ.  You are contagious for 24 hours until you have had 2 doses of the antibiotic.

## 2021-10-12 NOTE — ED Notes (Signed)
Pt to ED c/o sore throat since 3d. Pt in NAD. States child was diagnosed with flu recently. Swab already done.

## 2022-01-25 ENCOUNTER — Ambulatory Visit: Payer: Medicaid Other | Admitting: Family Medicine

## 2022-01-25 ENCOUNTER — Encounter: Payer: Self-pay | Admitting: Family Medicine

## 2022-01-25 DIAGNOSIS — A599 Trichomoniasis, unspecified: Secondary | ICD-10-CM

## 2022-01-25 DIAGNOSIS — Z113 Encounter for screening for infections with a predominantly sexual mode of transmission: Secondary | ICD-10-CM

## 2022-01-25 LAB — WET PREP FOR TRICH, YEAST, CLUE
Trichomonas Exam: POSITIVE — AB
Yeast Exam: NEGATIVE

## 2022-01-25 MED ORDER — METRONIDAZOLE 500 MG PO TABS: 500.0000 mg | ORAL_TABLET | Freq: Two times a day (BID) | ORAL | 0 refills | Status: AC

## 2022-01-25 NOTE — Progress Notes (Signed)
[facility] Department ? ?STI clinic/screening visit ?[address] ?[place] [address] ?[phone] ? ?Subjective:  ?Rebekah Dennis is a 26 y.o. female being seen today for an STI screening visit. The patient reports they do have symptoms.  Patient reports that they do not desire a pregnancy in the next year.   They reported they are not interested in discussing contraception today.   ? ?Patient's last menstrual period was 09/30/2021. ? ? ?Patient has the following medical conditions:   ?Patient Active Problem List  ? Diagnosis Date Noted  ? COVID-19 virus infection 03/01/2020  ? Chronic dermatitis 08/18/2015  ? ? ?Chief Complaint  ?Patient presents with  ? SEXUALLY TRANSMITTED DISEASE  ?  Screening  ? ? ?HPI ? ?Patient reports here for screening, reports symptoms.   ? ?Last HIV test per patient/review of record was 09/16/202 ?Patient reports last pap was 09/06/2020.  ? ?Screening for MPX risk: ?Does the patient have an unexplained rash? No ?Is the patient MSM? No ?Does the patient endorse multiple sex partners or anonymous sex partners? No ?Did the patient have close or sexual contact with a person diagnosed with MPX? No ?Has the patient traveled outside the [place] where MPX is endemic? No ?Is there a high clinical suspicion for MPX-- evidenced by one of the following No ? -Unlikely to be chickenpox ? -Lymphadenopathy ? -Rash that present in same phase of evolution on any given body part ?See flowsheet for further details and programmatic requirements.  ? ? ?The following portions of the patient's history were reviewed and updated as appropriate: allergies, current medications, past medical history, past social history, past surgical history and problem list. ? ?Objective:  ?There were no vitals filed for this visit. ? ?Physical Exam ?Vitals and nursing note reviewed.  ?Constitutional:   ?   Appearance: Normal appearance.  ?HENT:  ?   Head: Normocephalic and atraumatic.  ?   Mouth/Throat:  ?    Mouth: Mucous membranes are moist.  ?   Pharynx: Oropharynx is clear. No oropharyngeal exudate or posterior oropharyngeal erythema.  ?Pulmonary:  ?   Effort: Pulmonary effort is normal.  ?Abdominal:  ?   General: Abdomen is flat.  ?   Palpations: There is no mass.  ?   Tenderness: There is no abdominal tenderness. There is no rebound.  ?Genitourinary: ?   General: Normal vulva.  ?   Exam position: Lithotomy position.  ?   Pubic Area: No rash or pubic lice.   ?   Labia:     ?   Right: No rash or lesion.     ?   Left: No rash or lesion.   ?   Vagina: Normal. No vaginal discharge, erythema, bleeding or lesions.  ?   Cervix: No cervical motion tenderness, discharge, friability, lesion or erythema.  ?   Uterus: Normal.   ?   Adnexa: Right adnexa normal and left adnexa normal.  ?   Rectum: Normal.  ?   Comments: External genitalia without, lice, nits, erythema, edema , lesions or inguinal adenopathy. Vagina with normal mucosa odor and clear discharge and pH > 4.  Cervix without visual lesions, uterus firm, mobile, non-tender, no masses, CMT adnexal fullness or tenderness.   ?Lymphadenopathy:  ?   Head:  ?   Right side of head: No preauricular or posterior auricular adenopathy.  ?   Left side of head: No preauricular or posterior auricular adenopathy.  ?   Cervical: No cervical  adenopathy.  ?   Upper Body:  ?   Right upper body: No supraclavicular or axillary adenopathy.  ?   Left upper body: No supraclavicular or axillary adenopathy.  ?   Lower Body: No right inguinal adenopathy. No left inguinal adenopathy.  ?Skin: ?   General: Skin is warm and dry.  ?   Findings: No rash.  ?Neurological:  ?   Mental Status: She is alert and oriented to person, place, and time.  ? ? ? ?Assessment and Plan:  ?Rebekah Dennis is a 26 y.o. female presenting to the [facility] Department for STI screening ? ?1. Screening examination for venereal disease ?Patient accepted all screenings including wet prep, oral, vaginal CT/GC and  declined bloodwork for HIV/RPR.  ?Patient meets criteria for HepB screening? Yes. Ordered? No - declined  ?Patient meets criteria for HepC screening? Yes. Ordered? No - declined  ? ?Wet prep results +trich    ?Treatment needed  ?Discussed time line for [facility] results and that patient will be called with positive results and encouraged patient to call if she had not heard in 2 weeks.  ?Counseled to return or seek care for continued or worsening symptoms ?Recommended condom use with all sex ? ?Patient is currently using  no BCM  to prevent pregnancy.   ?- Chlamydia/Gonorrhea [place] [facility] ?- WET PREP FOR TRICH, YEAST, CLUE ?- Chlamydia/Gonorrhea [facility] ? ?2. Trichomonosis ? ?- metroNIDAZOLE (FLAGYL) 500 MG tablet; Take 1 tablet (500 mg total) by mouth 2 (two) times daily for 7 days.  Dispense: 14 tablet; Refill: 0 ? ? ? ? ?No follow-ups on file. ? ?No future appointments. ? ?Wendi Snipes, FNP ? ?

## 2022-01-25 NOTE — Progress Notes (Signed)
Pt here for STD screening.  Wet mount results reviewed and medication dispensed per SO.  Pt declined condoms. Jmarion Christiano M Josephmichael Lisenbee, RN  

## 2022-02-08 ENCOUNTER — Telehealth: Payer: Self-pay

## 2022-02-08 NOTE — Telephone Encounter (Signed)
Phone call to pt regarding positive chlamydia result from 01/25/22 vaginal specimen. ?Pt needs tx appt. ?(Pt treated for trich at 01/25/22 appt, not chlamydia.) ?

## 2022-02-08 NOTE — Telephone Encounter (Addendum)
Phone call to pt. Pt confirmed password from last visit. Counseled pt regarding positive CT result and need for tx. Tx appt scheduled for 02/09/22 due to work schedule. ?Pt states not currently using BC and NKA. Pt counseled to eat before coming for tx. ?

## 2022-02-09 ENCOUNTER — Ambulatory Visit: Payer: Medicaid Other

## 2022-02-09 DIAGNOSIS — A749 Chlamydial infection, unspecified: Secondary | ICD-10-CM

## 2022-02-09 MED ORDER — AZITHROMYCIN 500 MG PO TABS
1000.0000 mg | ORAL_TABLET | Freq: Once | ORAL | Status: AC
  Administered 2022-02-09: 1000 mg via ORAL

## 2022-02-09 NOTE — Progress Notes (Signed)
In Nurse Clinic for chlamydia tx. NKA. States she had IAB beginning of 12/2021, irregular vaginal bleeding since then. No bcm. Last sex approx 2 weeks ago, but unsure of date.  ?Treated today with Azithromycin 1 gram by mouth DOT once per SO Dr Lubertha Sayres. Advised to contact [facility] if vomits within 2 hrs of taking med. Questions answered and reports understanding. Josie Saunders, RN ? ?

## 2023-07-17 ENCOUNTER — Telehealth: Payer: 59 | Admitting: Physician Assistant

## 2023-07-17 DIAGNOSIS — U071 COVID-19: Secondary | ICD-10-CM

## 2023-07-17 MED ORDER — FLUTICASONE PROPIONATE 50 MCG/ACT NA SUSP: 2.0000 | Freq: Every day | NASAL | 1 refills | Status: AC

## 2023-07-17 MED ORDER — PSEUDOEPHEDRINE HCL ER 120 MG PO TB12: 120.0000 mg | ORAL_TABLET | Freq: Two times a day (BID) | ORAL | 0 refills | Status: AC

## 2023-07-17 NOTE — Progress Notes (Addendum)
E-Visit  for Positive Covid Test Result   We are sorry you are not feeling well. We are here to help!  You have tested positive for COVID-19, meaning that you were infected with the novel coronavirus and could give the virus to others.  Most people with COVID-19 have mild illness and can recover at home without medical care. Do not leave your home, except to get medical care. Do not visit public areas and do not go to places where you are unable to wear a mask. It is important that you stay home  to take care for yourself and to help protect other people in your home and community.      Isolation Instructions:   You are to isolate at home until you have been fever free for at least 24 hours without a fever-reducing medication, and symptoms have been steadily improving for 24 hours. At that time,  you can end isolation but need to mask for an additional 5 days.  If you must be around other household members who do not have symptoms, you need to make sure that both you and the family members are masking consistently with a high-quality mask.  If you note any worsening of symptoms despite treatment, please seek an in-person evaluation ASAP. If you note any significant shortness of breath or any chest pain, please seek ER evaluation. Please do not delay care!   Go to the nearest hospital ED for assessment if fever/cough/breathlessness are severe or illness seems like a threat to life.    The following symptoms may appear 2-14 days after exposure: Fever Cough Shortness of breath or difficulty breathing Chills Repeated shaking with chills Muscle pain Headache Sore throat New loss of taste or smell Fatigue Congestion or runny nose Nausea or vomiting Diarrhea  You can use medication such as prescription for Fluticasone nasal spray 2 sprays in each nostril one time per day. Pseudoephedrine 120mg  take one tablet twice daily if needed for head congestion.   You may also take acetaminophen  (Tylenol) as needed for fever.  HOME CARE: Only take medications as instructed by your medical team. Drink plenty of fluids and get plenty of rest. A steam or ultrasonic humidifier can help if you have congestion.   GET HELP RIGHT AWAY IF YOU HAVE EMERGENCY WARNING SIGNS.  Call 911 or proceed to your closest emergency facility if: You develop worsening high fever. Trouble breathing Bluish lips or face Persistent pain or pressure in the chest New confusion Inability to wake or stay awake You cough up blood. Your symptoms become more severe Inability to hold down food or fluids  This list is not all possible symptoms. Contact your medical provider for any symptoms that are severe or concerning to you.   Your e-visit answers were reviewed by a board certified advanced clinical practitioner to complete your personal care plan.  Depending on the condition, your plan could have included both over the counter or prescription medications.  If there is a problem please reply once you have received a response from your provider.  Your safety is important to [place].  If you have drug allergies check your prescription carefully.    You can use MyChart to ask questions about today's visit, request a non-urgent call back, or ask for a work or school excuse for 24 hours related to this e-Visit. If it has been greater than 24 hours you will need to follow up with your provider, or enter a new e-Visit to address  those concerns. You will get an e-mail in the next two days asking about your experience.  I hope that your e-visit has been valuable and will speed your recovery. Thank you for using e-visits.  I have spent 5 minutes in review of e-visit questionnaire, review and updating patient chart, medical decision making and response to patient.   Gilberto Better, PA-C
